# Patient Record
Sex: Male | Born: 1995 | Race: White | Hispanic: No | Marital: Single | Smoking: Never smoker
Health system: Southern US, Community
[De-identification: ages and names within clinical notes are randomized; demographics above are authoritative.]

## PROBLEM LIST (undated history)

## (undated) DIAGNOSIS — R011 Cardiac murmur, unspecified: Secondary | ICD-10-CM

## (undated) DIAGNOSIS — I38 Endocarditis, valve unspecified: Secondary | ICD-10-CM

## (undated) DIAGNOSIS — I050 Rheumatic mitral stenosis: Secondary | ICD-10-CM

## (undated) DIAGNOSIS — I350 Nonrheumatic aortic (valve) stenosis: Secondary | ICD-10-CM

## (undated) HISTORY — PX: HX HEART CATHETERIZATION: SHX148

## (undated) HISTORY — PX: HX HEART VALVE SURGERY: SHX40

## (undated) HISTORY — PX: AORTIC VALVE REPLACEMENT: SHX41

## (undated) HISTORY — PX: EXPLORATION POST OPERATIVE OPEN HEART: SHX5061

---
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
## 2015-02-07 ENCOUNTER — Encounter (HOSPITAL_COMMUNITY): Payer: Self-pay

## 2015-02-07 ENCOUNTER — Emergency Department (HOSPITAL_COMMUNITY): Payer: Worker's Compensation | Admitting: Anesthesiology

## 2015-02-07 ENCOUNTER — Inpatient Hospital Stay (HOSPITAL_COMMUNITY)
Admission: EM | Admit: 2015-02-07 | Discharge: 2015-02-12 | DRG: 956 | Disposition: A | Discharge status: Home Health | Payer: Worker's Compensation | Attending: General Surgery | Admitting: General Surgery

## 2015-02-07 ENCOUNTER — Inpatient Hospital Stay (HOSPITAL_COMMUNITY): Payer: Worker's Compensation

## 2015-02-07 ENCOUNTER — Encounter (HOSPITAL_COMMUNITY): Admission: EM | Disposition: A | Discharge status: Home Health | Payer: Self-pay | Source: Home / Self Care

## 2015-02-07 ENCOUNTER — Emergency Department (HOSPITAL_COMMUNITY): Payer: Worker's Compensation

## 2015-02-07 DIAGNOSIS — D62 Acute posthemorrhagic anemia: Secondary | ICD-10-CM | POA: Diagnosis not present

## 2015-02-07 DIAGNOSIS — S02609A Fracture of mandible, unspecified, initial encounter for closed fracture: Secondary | ICD-10-CM

## 2015-02-07 DIAGNOSIS — S62109A Fracture of unspecified carpal bone, unspecified wrist, initial encounter for closed fracture: Secondary | ICD-10-CM

## 2015-02-07 DIAGNOSIS — S62102A Fracture of unspecified carpal bone, left wrist, initial encounter for closed fracture: Secondary | ICD-10-CM

## 2015-02-07 DIAGNOSIS — S0181XA Laceration without foreign body of other part of head, initial encounter: Secondary | ICD-10-CM | POA: Diagnosis present

## 2015-02-07 DIAGNOSIS — S0261XA Fracture of condylar process of mandible, initial encounter for closed fracture: Secondary | ICD-10-CM | POA: Diagnosis present

## 2015-02-07 DIAGNOSIS — S025XXA Fracture of tooth (traumatic), initial encounter for closed fracture: Secondary | ICD-10-CM | POA: Diagnosis present

## 2015-02-07 DIAGNOSIS — S82002A Unspecified fracture of left patella, initial encounter for closed fracture: Secondary | ICD-10-CM | POA: Diagnosis present

## 2015-02-07 DIAGNOSIS — S7292XA Unspecified fracture of left femur, initial encounter for closed fracture: Secondary | ICD-10-CM

## 2015-02-07 DIAGNOSIS — Z952 Presence of prosthetic heart valve: Secondary | ICD-10-CM | POA: Diagnosis not present

## 2015-02-07 DIAGNOSIS — S3282XA Multiple fractures of pelvis without disruption of pelvic ring, initial encounter for closed fracture: Secondary | ICD-10-CM | POA: Diagnosis present

## 2015-02-07 DIAGNOSIS — W132XXA Fall from, out of or through roof, initial encounter: Secondary | ICD-10-CM | POA: Diagnosis present

## 2015-02-07 DIAGNOSIS — S62309A Unspecified fracture of unspecified metacarpal bone, initial encounter for closed fracture: Secondary | ICD-10-CM | POA: Diagnosis present

## 2015-02-07 DIAGNOSIS — T1490XA Injury, unspecified, initial encounter: Secondary | ICD-10-CM

## 2015-02-07 DIAGNOSIS — S52122A Displaced fracture of head of left radius, initial encounter for closed fracture: Secondary | ICD-10-CM | POA: Diagnosis present

## 2015-02-07 DIAGNOSIS — IMO0002 Reserved for concepts with insufficient information to code with codable children: Secondary | ICD-10-CM

## 2015-02-07 DIAGNOSIS — W19XXXA Unspecified fall, initial encounter: Secondary | ICD-10-CM | POA: Diagnosis present

## 2015-02-07 DIAGNOSIS — S060XAA Concussion with loss of consciousness status unknown, initial encounter: Secondary | ICD-10-CM | POA: Diagnosis present

## 2015-02-07 DIAGNOSIS — T1490 Injury, unspecified: Secondary | ICD-10-CM | POA: Diagnosis present

## 2015-02-07 DIAGNOSIS — S7292XB Unspecified fracture of left femur, initial encounter for open fracture type I or II: Secondary | ICD-10-CM | POA: Diagnosis present

## 2015-02-07 DIAGNOSIS — S52502A Unspecified fracture of the lower end of left radius, initial encounter for closed fracture: Secondary | ICD-10-CM | POA: Diagnosis present

## 2015-02-07 DIAGNOSIS — S060X9A Concussion with loss of consciousness of unspecified duration, initial encounter: Secondary | ICD-10-CM | POA: Diagnosis present

## 2015-02-07 DIAGNOSIS — S02600B Fracture of unspecified part of body of mandible, initial encounter for open fracture: Secondary | ICD-10-CM

## 2015-02-07 DIAGNOSIS — S329XXA Fracture of unspecified parts of lumbosacral spine and pelvis, initial encounter for closed fracture: Secondary | ICD-10-CM

## 2015-02-07 HISTORY — PX: CLOSED REDUCTION MANDIBLE WITH MANDIBULOMA: SHX5313

## 2015-02-07 HISTORY — PX: CLOSED REDUCTION RADIAL SHAFT: SHX5008

## 2015-02-07 HISTORY — DX: Nonrheumatic aortic (valve) stenosis: I35.0

## 2015-02-07 HISTORY — PX: ORIF RADIAL FRACTURE: SHX5113

## 2015-02-07 HISTORY — DX: Rheumatic mitral stenosis: I05.0

## 2015-02-07 HISTORY — PX: ORIF FEMUR FRACTURE: SHX2119

## 2015-02-07 HISTORY — PX: FACIAL LACERATION REPAIR: SHX6589

## 2015-02-07 HISTORY — PX: OPEN REDUCTION INTERNAL FIXATION (ORIF) DISTAL RADIAL FRACTURE: SHX5989

## 2015-02-07 HISTORY — PX: CLOSED REDUCTION MANDIBLE: SHX5307

## 2015-02-07 LAB — I-STAT CHEM 8, ED
BUN: 12 mg/dL (ref 6–23)
CREATININE: 1 mg/dL (ref 0.50–1.35)
Calcium, Ion: 1.19 mmol/L (ref 1.12–1.23)
Chloride: 105 mmol/L (ref 96–112)
GLUCOSE: 135 mg/dL — AB (ref 70–99)
HEMATOCRIT: 52 % (ref 39.0–52.0)
Hemoglobin: 17.7 g/dL — ABNORMAL HIGH (ref 13.0–17.0)
Potassium: 2.9 mmol/L — ABNORMAL LOW (ref 3.5–5.1)
Sodium: 142 mmol/L (ref 135–145)
TCO2: 18 mmol/L (ref 0–100)

## 2015-02-07 LAB — COMPREHENSIVE METABOLIC PANEL
ALT: 45 U/L (ref 0–53)
AST: 63 U/L — ABNORMAL HIGH (ref 0–37)
Albumin: 4.9 g/dL (ref 3.5–5.2)
Alkaline Phosphatase: 90 U/L (ref 39–117)
Anion gap: 13 (ref 5–15)
BUN: 11 mg/dL (ref 6–23)
CO2: 21 mmol/L (ref 19–32)
CREATININE: 1.11 mg/dL (ref 0.50–1.35)
Calcium: 10.1 mg/dL (ref 8.4–10.5)
Chloride: 105 mmol/L (ref 96–112)
GFR calc Af Amer: >90 mL/min (ref >90)
Glucose, Bld: 124 mg/dL — ABNORMAL HIGH (ref 70–99)
POTASSIUM: 3.1 mmol/L — AB (ref 3.5–5.1)
Sodium: 139 mmol/L (ref 135–145)
Total Bilirubin: 0.7 mg/dL (ref 0.3–1.2)
Total Protein: 7.9 g/dL (ref 6.0–8.3)

## 2015-02-07 LAB — CBC
HCT: 47.4 % (ref 39.0–52.0)
HEMOGLOBIN: 16.5 g/dL (ref 13.0–17.0)
MCH: 30.4 pg (ref 26.0–34.0)
MCHC: 34.8 g/dL (ref 30.0–36.0)
MCV: 87.3 fL (ref 78.0–100.0)
Platelets: 328 10*3/uL (ref 150–400)
RBC: 5.43 MIL/uL (ref 4.22–5.81)
RDW: 13 % (ref 11.5–15.5)
WBC: 20.9 10*3/uL — ABNORMAL HIGH (ref 4.0–10.5)

## 2015-02-07 LAB — ETHANOL

## 2015-02-07 LAB — TYPE AND SCREEN
ABO/RH(D): O POS
ANTIBODY SCREEN: NEGATIVE

## 2015-02-07 LAB — PROTIME-INR
INR: 1.16 (ref 0.00–1.49)
Prothrombin Time: 14.9 seconds (ref 11.6–15.2)

## 2015-02-07 LAB — I-STAT CG4 LACTIC ACID, ED: Lactic Acid, Venous: 3.53 mmol/L (ref 0.5–2.0)

## 2015-02-07 SURGERY — OPEN REDUCTION INTERNAL FIXATION (ORIF) DISTAL RADIUS FRACTURE
Anesthesia: General | Site: Face | Laterality: Left

## 2015-02-07 MED ORDER — PROPOFOL 10 MG/ML IV BOLUS
INTRAVENOUS | Status: AC
  Filled 2015-02-07: qty 20

## 2015-02-07 MED ORDER — CEFAZOLIN SODIUM-DEXTROSE 2-3 GM-% IV SOLR
INTRAVENOUS | Status: AC
  Filled 2015-02-07: qty 50

## 2015-02-07 MED ORDER — BUPIVACAINE-EPINEPHRINE (PF) 0.5% -1:200000 IJ SOLN
INTRAMUSCULAR | Status: AC
  Filled 2015-02-07: qty 30

## 2015-02-07 MED ORDER — GLYCOPYRROLATE 0.2 MG/ML IJ SOLN
INTRAMUSCULAR | Status: DC | PRN
  Administered 2015-02-07: 0.4 mg via INTRAVENOUS

## 2015-02-07 MED ORDER — CEFAZOLIN SODIUM-DEXTROSE 2-3 GM-% IV SOLR
2.0000 g | Freq: Once | INTRAVENOUS | Status: AC
  Administered 2015-02-07: 2 g via INTRAVENOUS
  Filled 2015-02-07: qty 50

## 2015-02-07 MED ORDER — ONDANSETRON HCL 4 MG/2ML IJ SOLN
INTRAMUSCULAR | Status: AC
  Filled 2015-02-07: qty 2

## 2015-02-07 MED ORDER — HYDROMORPHONE HCL 1 MG/ML IJ SOLN: 0.2500 mg | INTRAMUSCULAR | Status: DC | PRN

## 2015-02-07 MED ORDER — LIDOCAINE HCL (CARDIAC) 20 MG/ML IV SOLN
INTRAVENOUS | Status: DC | PRN
  Administered 2015-02-07: 80 mg via INTRAVENOUS

## 2015-02-07 MED ORDER — DIPHENHYDRAMINE HCL 50 MG/ML IJ SOLN
INTRAMUSCULAR | Status: AC
  Filled 2015-02-07: qty 2

## 2015-02-07 MED ORDER — PROMETHAZINE HCL 6.25 MG/5ML PO SYRP
25.0000 mg | ORAL_SOLUTION | Freq: Four times a day (QID) | ORAL | Status: DC | PRN
  Administered 2015-02-11: 25 mg via ORAL
  Filled 2015-02-07 (×3): qty 20

## 2015-02-07 MED ORDER — ROCURONIUM BROMIDE 100 MG/10ML IV SOLN
INTRAVENOUS | Status: DC | PRN
  Administered 2015-02-07: 20 mg via INTRAVENOUS
  Administered 2015-02-07: 30 mg via INTRAVENOUS

## 2015-02-07 MED ORDER — SUCCINYLCHOLINE CHLORIDE 20 MG/ML IJ SOLN
INTRAMUSCULAR | Status: AC
  Filled 2015-02-07: qty 2

## 2015-02-07 MED ORDER — FENTANYL CITRATE 0.05 MG/ML IJ SOLN
INTRAMUSCULAR | Status: AC
  Filled 2015-02-07: qty 5

## 2015-02-07 MED ORDER — LACTATED RINGERS IV SOLN
INTRAVENOUS | Status: DC | PRN
  Administered 2015-02-07 (×3): via INTRAVENOUS

## 2015-02-07 MED ORDER — FENTANYL CITRATE 0.05 MG/ML IJ SOLN
INTRAMUSCULAR | Status: AC
  Filled 2015-02-07: qty 2

## 2015-02-07 MED ORDER — PROPOFOL 10 MG/ML IV BOLUS
INTRAVENOUS | Status: DC | PRN
  Administered 2015-02-07: 200 mg via INTRAVENOUS
  Administered 2015-02-07: 100 mg via INTRAVENOUS

## 2015-02-07 MED ORDER — LIDOCAINE HCL (CARDIAC) 20 MG/ML IV SOLN
INTRAVENOUS | Status: AC
  Filled 2015-02-07: qty 5

## 2015-02-07 MED ORDER — ALBUMIN HUMAN 5 % IV SOLN
INTRAVENOUS | Status: DC | PRN
  Administered 2015-02-07 (×2): via INTRAVENOUS

## 2015-02-07 MED ORDER — NEOSTIGMINE METHYLSULFATE 10 MG/10ML IV SOLN
INTRAVENOUS | Status: DC | PRN
  Administered 2015-02-07: 3 mg via INTRAVENOUS

## 2015-02-07 MED ORDER — ARTIFICIAL TEARS OP OINT
TOPICAL_OINTMENT | OPHTHALMIC | Status: AC
  Filled 2015-02-07: qty 3.5

## 2015-02-07 MED ORDER — IOHEXOL 300 MG/ML  SOLN: 100.0000 mL | Freq: Once | INTRAMUSCULAR | Status: AC | PRN

## 2015-02-07 MED ORDER — LIDOCAINE-EPINEPHRINE 1 %-1:100000 IJ SOLN
INTRAMUSCULAR | Status: AC
  Filled 2015-02-07: qty 1

## 2015-02-07 MED ORDER — ONDANSETRON HCL 4 MG/2ML IJ SOLN: 4.0000 mg | Freq: Four times a day (QID) | INTRAMUSCULAR | Status: DC | PRN

## 2015-02-07 MED ORDER — DIPHENHYDRAMINE HCL 50 MG/ML IJ SOLN
INTRAMUSCULAR | Status: DC | PRN
  Administered 2015-02-07: 25 mg via INTRAVENOUS

## 2015-02-07 MED ORDER — MIDAZOLAM HCL 2 MG/2ML IJ SOLN
INTRAMUSCULAR | Status: AC
  Filled 2015-02-07: qty 2

## 2015-02-07 MED ORDER — SODIUM CHLORIDE 0.9 % IJ SOLN
INTRAMUSCULAR | Status: AC
  Filled 2015-02-07: qty 10

## 2015-02-07 MED ORDER — FENTANYL CITRATE 0.05 MG/ML IJ SOLN
INTRAMUSCULAR | Status: DC | PRN
  Administered 2015-02-07: 100 ug via INTRAVENOUS
  Administered 2015-02-07: 50 ug via INTRAVENOUS
  Administered 2015-02-07: 100 ug via INTRAVENOUS
  Administered 2015-02-07 (×5): 50 ug via INTRAVENOUS

## 2015-02-07 MED ORDER — MIDAZOLAM HCL 5 MG/5ML IJ SOLN
INTRAMUSCULAR | Status: DC | PRN
  Administered 2015-02-07 (×2): 1 mg via INTRAVENOUS

## 2015-02-07 MED ORDER — 0.9 % SODIUM CHLORIDE (POUR BTL) OPTIME
TOPICAL | Status: DC | PRN
  Administered 2015-02-07: 1500 mL

## 2015-02-07 MED ORDER — ROCURONIUM BROMIDE 50 MG/5ML IV SOLN
INTRAVENOUS | Status: AC
  Filled 2015-02-07: qty 1

## 2015-02-07 MED ORDER — LIDOCAINE-EPINEPHRINE 1 %-1:100000 IJ SOLN
INTRAMUSCULAR | Status: DC | PRN
  Administered 2015-02-07: 8 mL

## 2015-02-07 MED ORDER — EPHEDRINE SULFATE 50 MG/ML IJ SOLN
INTRAMUSCULAR | Status: AC
  Filled 2015-02-07: qty 1

## 2015-02-07 MED ORDER — GLYCOPYRROLATE 0.2 MG/ML IJ SOLN
INTRAMUSCULAR | Status: AC
  Filled 2015-02-07: qty 3

## 2015-02-07 MED ORDER — OXYCODONE HCL 5 MG PO TABS: 5.0000 mg | ORAL_TABLET | Freq: Once | ORAL | Status: AC | PRN

## 2015-02-07 MED ORDER — DEXAMETHASONE SODIUM PHOSPHATE 10 MG/ML IJ SOLN
INTRAMUSCULAR | Status: DC | PRN
  Administered 2015-02-07: 10 mg via INTRAVENOUS

## 2015-02-07 MED ORDER — CEFAZOLIN SODIUM-DEXTROSE 2-3 GM-% IV SOLR
2.0000 g | INTRAVENOUS | Status: AC
  Administered 2015-02-07: 2 g via INTRAVENOUS

## 2015-02-07 MED ORDER — NEOSTIGMINE METHYLSULFATE 10 MG/10ML IV SOLN
INTRAVENOUS | Status: AC
  Filled 2015-02-07: qty 1

## 2015-02-07 MED ORDER — FENTANYL CITRATE 0.05 MG/ML IJ SOLN
100.0000 ug | Freq: Once | INTRAMUSCULAR | Status: AC
  Administered 2015-02-07: 100 ug via INTRAVENOUS

## 2015-02-07 MED ORDER — OXYMETAZOLINE HCL 0.05 % NA SOLN
NASAL | Status: DC | PRN
  Administered 2015-02-07: 2 via NASAL

## 2015-02-07 MED ORDER — PROMETHAZINE HCL 25 MG RE SUPP: 25.0000 mg | Freq: Four times a day (QID) | RECTAL | Status: DC | PRN

## 2015-02-07 MED ORDER — ONDANSETRON HCL 4 MG/2ML IJ SOLN
INTRAMUSCULAR | Status: DC | PRN
  Administered 2015-02-07: 4 mg via INTRAVENOUS

## 2015-02-07 MED ORDER — SUCCINYLCHOLINE CHLORIDE 20 MG/ML IJ SOLN
INTRAMUSCULAR | Status: DC | PRN
  Administered 2015-02-07: 140 mg via INTRAVENOUS

## 2015-02-07 MED ORDER — FENTANYL CITRATE 0.05 MG/ML IJ SOLN
INTRAMUSCULAR | Status: AC
  Administered 2015-02-07: 100 ug
  Filled 2015-02-07: qty 2

## 2015-02-07 MED ORDER — ONDANSETRON HCL 4 MG/2ML IJ SOLN
INTRAMUSCULAR | Status: AC
  Administered 2015-02-07: 4 mg
  Filled 2015-02-07: qty 2

## 2015-02-07 MED ORDER — CEFAZOLIN SODIUM-DEXTROSE 2-3 GM-% IV SOLR: 2.0000 g | INTRAVENOUS | Status: DC

## 2015-02-07 MED ORDER — PHENYLEPHRINE HCL 10 MG/ML IJ SOLN
INTRAMUSCULAR | Status: AC
  Filled 2015-02-07: qty 1

## 2015-02-07 MED ORDER — ONDANSETRON HCL 4 MG/2ML IJ SOLN
INTRAMUSCULAR | Status: AC
  Filled 2015-02-07: qty 6

## 2015-02-07 MED ORDER — CHLORHEXIDINE GLUCONATE 0.12 % MT SOLN
5.0000 mL | Freq: Four times a day (QID) | OROMUCOSAL | Status: DC
  Administered 2015-02-08 – 2015-02-12 (×13): 5 mL via OROMUCOSAL
  Filled 2015-02-07 (×20): qty 15

## 2015-02-07 MED ORDER — OXYCODONE HCL 5 MG/5ML PO SOLN: 5.0000 mg | Freq: Once | ORAL | Status: AC | PRN

## 2015-02-07 MED ORDER — OXYMETAZOLINE HCL 0.05 % NA SOLN
NASAL | Status: AC
  Filled 2015-02-07: qty 15

## 2015-02-07 SURGICAL SUPPLY — 100 items
3.5/2.7MM PLATE REDUCTION TOOL ×5 IMPLANT
BANDAGE ELASTIC 4 VELCRO ST LF (GAUZE/BANDAGES/DRESSINGS) ×20 IMPLANT
BANDAGE ELASTIC 6 VELCRO ST LF (GAUZE/BANDAGES/DRESSINGS) ×5 IMPLANT
BANDAGE ESMARK 6X9 LF (GAUZE/BANDAGES/DRESSINGS) ×4 IMPLANT
BIT DRILL 2 FAST STEP (BIT) ×5 IMPLANT
BIT DRILL 4.3 (BIT) ×5
BIT DRILL 4.3X300MM (BIT) ×4 IMPLANT
BIT DRILL CANN 1.6 BIODRIVE (BIT) ×5 IMPLANT
BIT DRILL QC 3.3X195 (BIT) ×5 IMPLANT
BLADE SURG 15 STRL LF DISP TIS (BLADE) ×4 IMPLANT
BLADE SURG 15 STRL SS (BLADE) ×1
BLADE SURG ROTATE 9660 (MISCELLANEOUS) IMPLANT
BNDG COHESIVE 6X5 TAN STRL LF (GAUZE/BANDAGES/DRESSINGS) ×5 IMPLANT
BNDG ESMARK 6X9 LF (GAUZE/BANDAGES/DRESSINGS) ×5
BNDG GAUZE ELAST 4 BULKY (GAUZE/BANDAGES/DRESSINGS) ×10 IMPLANT
CAP LOCK NCB (Cap) ×25 IMPLANT
COVER SURGICAL LIGHT HANDLE (MISCELLANEOUS) ×10 IMPLANT
CUFF TOURNIQUET SINGLE 34IN LL (TOURNIQUET CUFF) IMPLANT
DERMABOND ADVANCED (GAUZE/BANDAGES/DRESSINGS) ×1
DERMABOND ADVANCED .7 DNX12 (GAUZE/BANDAGES/DRESSINGS) ×4 IMPLANT
DRAPE C-ARM 42X72 X-RAY (DRAPES) ×5 IMPLANT
DRAPE IMP U-DRAPE 54X76 (DRAPES) ×5 IMPLANT
DRAPE INCISE IOBAN 66X45 STRL (DRAPES) ×5 IMPLANT
DRAPE OEC MINIVIEW 54X84 (DRAPES) ×5 IMPLANT
DRAPE ORTHO SPLIT 77X108 STRL (DRAPES) ×2
DRAPE PROXIMA HALF (DRAPES) ×10 IMPLANT
DRAPE SURG ORHT 6 SPLT 77X108 (DRAPES) ×8 IMPLANT
DRAPE U-SHAPE 47X51 STRL (DRAPES) ×5 IMPLANT
DRILL BIT NCB 2.5 (BIT) ×5 IMPLANT
DRSG ADAPTIC 3X8 NADH LF (GAUZE/BANDAGES/DRESSINGS) ×5 IMPLANT
DRSG EMULSION OIL 3X3 NADH (GAUZE/BANDAGES/DRESSINGS) ×5 IMPLANT
DRSG MEPILEX BORDER 4X4 (GAUZE/BANDAGES/DRESSINGS) ×5 IMPLANT
DRSG MEPILEX BORDER 4X8 (GAUZE/BANDAGES/DRESSINGS) ×5 IMPLANT
DRSG PAD ABDOMINAL 8X10 ST (GAUZE/BANDAGES/DRESSINGS) ×5 IMPLANT
ELECT REM PT RETURN 9FT ADLT (ELECTROSURGICAL) ×5
ELECTRODE REM PT RTRN 9FT ADLT (ELECTROSURGICAL) ×4 IMPLANT
GLOVE BIO SURGEON STRL SZ7 (GLOVE) ×5 IMPLANT
GLOVE BIO SURGEON STRL SZ7.5 (GLOVE) ×15 IMPLANT
GLOVE BIOGEL PI IND STRL 7.0 (GLOVE) ×8 IMPLANT
GLOVE BIOGEL PI IND STRL 8 (GLOVE) ×4 IMPLANT
GLOVE BIOGEL PI INDICATOR 7.0 (GLOVE) ×2
GLOVE BIOGEL PI INDICATOR 8 (GLOVE) ×1
GLOVE SURG ORTHO 7.0 STRL STRW (GLOVE) ×5 IMPLANT
GLOVE SURG SS PI 7.0 STRL IVOR (GLOVE) IMPLANT
GOWN STRL REUS W/ TWL LRG LVL3 (GOWN DISPOSABLE) ×8 IMPLANT
GOWN STRL REUS W/ TWL XL LVL3 (GOWN DISPOSABLE) ×4 IMPLANT
GOWN STRL REUS W/TWL LRG LVL3 (GOWN DISPOSABLE) ×2
GOWN STRL REUS W/TWL XL LVL3 (GOWN DISPOSABLE) ×1
K-WIRE 2.0 (WIRE) ×5
K-WIRE FXSTD 280X2XNS SS (WIRE) ×20
KIT BASIN OR (CUSTOM PROCEDURE TRAY) ×5 IMPLANT
KIT ROOM TURNOVER OR (KITS) ×5 IMPLANT
KWIRE FXSTD 280X2XNS SS (WIRE) ×20 IMPLANT
MANIFOLD NEPTUNE II (INSTRUMENTS) ×5 IMPLANT
NCB Locking Cap ×25 IMPLANT
NEEDLE 22X1 1/2 (OR ONLY) (NEEDLE) ×5 IMPLANT
NS IRRIG 1000ML POUR BTL (IV SOLUTION) ×5 IMPLANT
PACK GENERAL/GYN (CUSTOM PROCEDURE TRAY) ×5 IMPLANT
PACK UNIVERSAL I (CUSTOM PROCEDURE TRAY) ×5 IMPLANT
PAD ARMBOARD 7.5X6 YLW CONV (MISCELLANEOUS) ×10 IMPLANT
PAD CAST 4YDX4 CTTN HI CHSV (CAST SUPPLIES) ×12 IMPLANT
PADDING CAST ABS 4INX4YD NS (CAST SUPPLIES) ×5
PADDING CAST ABS COTTON 4X4 ST (CAST SUPPLIES) ×20 IMPLANT
PADDING CAST COTTON 4X4 STRL (CAST SUPPLIES) ×3
PLATE DIST FEMUR NCB 9 HOLE (Plate) ×5 IMPLANT
PLATE LOCK 2.5MM Y-SHAPE (Plate) ×5 IMPLANT
PLATE REDUCTION TOOL 3.5/2.7MM (Plate) ×5 IMPLANT
SCREW 5.0 80MM (Screw) ×15 IMPLANT
SCREW BIODRIVE MICRO 2.3X20 (Screw) ×5 IMPLANT
SCREW BIODRIVE MICRO 2.3X24 (Screw) ×5 IMPLANT
SCREW CANCELLOUS 5.0X80 (Screw) ×5 IMPLANT
SCREW CANCELLOUS 5.0X85 (Screw) ×5 IMPLANT
SCREW MULTI DIRECT 14MM (Screw) ×5 IMPLANT
SCREW MULTI DIRECT 16MM (Screw) ×5 IMPLANT
SCREW NCB 4.0MX44M (Screw) ×5 IMPLANT
SCREW NCB 4.0MX50M (Screw) ×5 IMPLANT
SCREW NCB 5.0X34MM (Screw) ×10 IMPLANT
SCREW NCB 5.0X36MM (Screw) ×10 IMPLANT
SCREW PEG 2.5X16 NONLOCK (Screw) ×10 IMPLANT
SCREW UPPER FACE 2.0X12MM (Screw) ×40 IMPLANT
SLEEVE SURGEON STRL (DRAPES) ×5 IMPLANT
SPLINT PLASTER CAST XFAST 4X15 (CAST SUPPLIES) ×20 IMPLANT
SPLINT PLASTER XTRA FAST SET 4 (CAST SUPPLIES) ×5
SPONGE GAUZE 4X4 12PLY STER LF (GAUZE/BANDAGES/DRESSINGS) ×10 IMPLANT
SPONGE LAP 18X18 X RAY DECT (DISPOSABLE) ×5 IMPLANT
STOCKINETTE IMPERVIOUS LG (DRAPES) ×5 IMPLANT
SUCTION FRAZIER TIP 10 FR DISP (SUCTIONS) ×5 IMPLANT
SUT ETHILON 3 0 PS 1 (SUTURE) ×25 IMPLANT
SUT MNCRL AB 4-0 PS2 18 (SUTURE) ×5 IMPLANT
SUT MON AB 2-0 CT1 27 (SUTURE) ×5 IMPLANT
SUT VIC AB 0 CT1 27 (SUTURE) ×2
SUT VIC AB 0 CT1 27XBRD ANBCTR (SUTURE) ×8 IMPLANT
SYR BULB 3OZ (MISCELLANEOUS) ×5 IMPLANT
SYR CONTROL 10ML LL (SYRINGE) ×5 IMPLANT
TOWEL OR 17X24 6PK STRL BLUE (TOWEL DISPOSABLE) ×5 IMPLANT
TOWEL OR 17X26 10 PK STRL BLUE (TOWEL DISPOSABLE) ×5 IMPLANT
TOWEL OR NON WOVEN STRL DISP B (DISPOSABLE) ×5 IMPLANT
TRAY FOLEY CATH 16FRSI W/METER (SET/KITS/TRAYS/PACK) ×5 IMPLANT
WATER STERILE IRR 1000ML POUR (IV SOLUTION) ×5 IMPLANT
WIRE K 1.6MM 144256 (MISCELLANEOUS) ×15 IMPLANT

## 2015-02-07 NOTE — Op Note (Signed)
02/07/2015  8:18 PM    Granade, Homero FellersFrank  [id number]   Pre-Op Dx:  RIGHT condylar head, LEFT comminuted body  Mandible fracture(s), LEFT forehead laceration, LEFT chin laceration  Post-op Dx: same  Proc: Mandibulo-maxillary fixation , closure lacerations  Surg:  Flo ShanksWOLICKI, Maryfer Tauzin T MD  Anes:  GNT  EBL:  min  Comp:  none  Findings:   4 cm LEFT forehead lac through the frontalis muscle but with no exposed bone.  3 cm LEFT chin laceration with no exposed mandible.  Multiple fractures teeth.  Mobile tooth #21.  Good occlusion established.  Procedure: With the patient in a comfortable supine position, general nasotracheal anesthesia was induced without difficulty.  He had received preoperative Afrin spray for nasal airway decongestion and to prevent epistaxis with intubation.  The case was done contemporaneously with Dr. Renaye Rakersim Murphy.  At an appropriate level, the patient was in a supine position.    The pharynx was suctioned clear.  A throat pack composed of 2 x 2's sutured with a 2-0 silk was moistened and then placed. Betadine solution tooth brushing was performed by way of preparation.  This was suctioned clear.  1% Xylocaine with 1:100,000 epinephrine,  8 cc's total, was infiltrated to each side of the pyriform aperture, and on the anterior inferior mandible on each side in anticipation of bicortical screw placement.  Several minutes were allowed for this to take effect.  The jaws were manipulated to reestablish occlusion.   X-rays were reviewed.  The LEFT chin and LEFT forehead laceration had a standard sterile preparation and draping.  They were closed with two layers of 4-0 Vicryl, then Dermabond on the skin.    A small stab incision was made at each screw site  down to the bone.  12 mm bicortical screws were placed superomedial to the canine roots maxillary, and inferomedial to the canine roots mandibular. The LEFT mandibular screw had to be placed slightly more medially to avoid the  fracture line.   Hemostasis was observed.  24-gauge stainless steel wire loops were prepared.  The pharynx was suctioned free and the throat pack was removed.  The jaws were manipulated back into occlusion.  Two vertical and two crisscross wire loops were applied and gently tightened down.  Good occlusion was noted.  Tightening was completed.  The wires were cut and then twisted with the ends buried to avoid trauma to the oral vestibule.  Hemostasis was observed.  At this point, Dr. [name] was continuing with his repairs.  The Otolaryngology portion of the procedure was completed.   Dispo:   PACU to 23 hour observation.  Plan: Ice, elevation,  Narcotic analgesia, antiemetics.  We will send him home with wire cutters.  We will check a Panorex prior to discharge.  He will receive a dietary consult regarding liquid diet.  Recheck my office one week.   Cephus RicherWOLICKI,  Armando Bukhari T MD

## 2015-02-07 NOTE — ED Notes (Signed)
Pt in radiology with Lawson FiscalLori, RN

## 2015-02-07 NOTE — ED Notes (Signed)
Per [facility], pt working on roof and fell 30" through industrial roof, has left open femur fx, left forearm deformity. 3 inch to left forehead. Pt was confused with EMS, A and oriented on arrival to ED. Good pulses to all extremities. 1 inch to left chin. Found lying 5 ft from a shelf. Unsure if he fell straight down.

## 2015-02-07 NOTE — Progress Notes (Signed)
Chaplain responded to level two trauma, fall from roof. Chaplain present at pt arrival. Called pt father, Mills KollerRichard Bas [phone], upon pt request. Pt coworkers at hospital. Lunette Standshaplain offered them hospitality and escorted them to second floor [facility] waiting area. Pt [place], will need to be updated upon arrival. After CT chaplain briefly spoke with pt. Pt reported being scared and chaplain affirmed these feelings. Pt worried about his teeth during surgery, MD reassured pt at bedside. Chaplain will continue to follow. Page as needed [phone]. StameyMayer Masker, Glenyce Randle F, Chaplain 02/07/2015  5:49 PM

## 2015-02-07 NOTE — H&P (Signed)
History   [name] [name] is an 19 y.o. male.   Chief Complaint:  Chief Complaint  Patient presents with  . Trauma   Was at work working on roof when he took a wrong step and fell thru ceiling/insulation about 73ft; amnesic to event. C/o of jaw pain, loose teeth, L wrist/leg pain.   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 Takes 325 ASA a day.  Trauma Mechanism of injury: fall Injury location: face, mouth, hand, leg and pelvis Injury location detail: lower teeth, forehead, L wrist, pelvis and L knee and L upper leg Incident location: at work Arrived directly from scene: yes   Fall:      Fall occurred: from a roof      Height of fall: 9ft      Impact surface: unknown      Point of impact: unknown      Entrapped after fall: no      Suspicion of alcohol use: no      Suspicion of drug use: no  EMS/PTA data:      Ambulatory at scene: no      Blood loss: minimal      Responsiveness: alert      Oriented to: person, situation and place      Loss of consciousness: yes      Amnesic to event: yes      Airway interventions: suction      Breathing interventions: oxygen      IV access: established      Medications administered: fentanyl      Immobilization: C-collar, long board and LLE splint      Mental status condition since incident: improving  Current symptoms:      Pain scale: 8/10      Pain quality: sharp and pressure      Associated symptoms:            Reports back pain, loss of consciousness and nausea.            Denies abdominal pain, blindness, chest pain, difficulty breathing, headache, neck pain, seizures and vomiting.   Relevant PMH:      Tetanus status: UTD   Past Medical History  Diagnosis Date  . Aortic valve stenosis   . Mitral valve stenosis     Past Surgical History  Procedure Laterality Date  . Exploration post operative open heart      x 2  . 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 No  pertinent family history. Social History:  reports that he has never smoked. He uses smokeless tobacco. His alcohol and drug histories are not on file.  Allergies  No Known Allergies  Home Medications   (Not in a hospital admission)  Trauma Course   Results for orders placed or performed during the hospital encounter of 02/07/15 (from the past 48 hour(s))  Type and screen     Status: None   Collection Time: 02/07/15  3:39 PM  Result Value Ref Range   ABO/RH(D) O POS    Antibody Screen NEG    Sample Expiration 02/10/2015   I-Stat Chem 8, ED     Status: Abnormal   Collection Time: 02/07/15  3:42 PM  Result Value Ref Range   Sodium 142 135 - 145 mmol/L   Potassium 2.9 (L) 3.5 - 5.1 mmol/L   Chloride 105 96 - 112 mmol/L   BUN 12 6 - 23 mg/dL  Creatinine, Ser 1.00 0.50 - 1.35 mg/dL   Glucose, Bld 161 (H) 70 - 99 mg/dL   Calcium, Ion 0.96 0.45 - 1.23 mmol/L   TCO2 18 0 - 100 mmol/L   Hemoglobin 17.7 (H) 13.0 - 17.0 g/dL   HCT 40.9 81.1 - 91.4 %  I-Stat CG4 Lactic Acid, ED     Status: Abnormal   Collection Time: 02/07/15  3:42 PM  Result Value Ref Range   Lactic Acid, Venous 3.53 (HH) 0.5 - 2.0 mmol/L   Comment NOTIFIED PHYSICIAN   Ethanol     Status: None   Collection Time: 02/07/15  3:43 PM  Result Value Ref Range   Alcohol, Ethyl (B) <5 0 - 9 mg/dL    Comment:        LOWEST DETECTABLE LIMIT FOR SERUM ALCOHOL IS 11 mg/dL FOR MEDICAL PURPOSES ONLY   CBC     Status: Abnormal   Collection Time: 02/07/15  5:00 PM  Result Value Ref Range   WBC 20.9 (H) 4.0 - 10.5 K/uL   RBC 5.43 4.22 - 5.81 MIL/uL   Hemoglobin 16.5 13.0 - 17.0 g/dL   HCT 78.2 95.6 - 21.3 %   MCV 87.3 78.0 - 100.0 fL   MCH 30.4 26.0 - 34.0 pg   MCHC 34.8 30.0 - 36.0 g/dL   RDW 08.6 57.8 - 46.9 %   Platelets 328 150 - 400 K/uL  Protime-INR     Status: None   Collection Time: 02/07/15  5:00 PM  Result Value Ref Range   Prothrombin Time 14.9 11.6 - 15.2 seconds   INR 1.16 0.00 - 1.49   Dg Forearm  Left  02/07/2015   CLINICAL DATA:  Fall through roof from 30 foot height. Left forearm pain and physical deformity. Initial encounter.  EXAM: LEFT FOREARM - 2 VIEW  COMPARISON:  None.  FINDINGS: A comminuted fracture of the distal radius is seen, with involvement of the radiocarpal and distal radial ulnar joints. There is posterior displacement and angulation of the distal radial fracture fragments.  In addition, there is a comminuted fracture of the radial head. No ulnar fracture identified.  IMPRESSION: Comminuted distal radius fracture with dorsal displacement and angulation.  Comminuted fracture of the radial head. Dedicated elbow radiographs recommended for further evaluation.   Electronically Signed   By: Myles Rosenthal M.D.   On: 02/07/2015 17:35   Dg Wrist Complete Left  02/07/2015   CLINICAL DATA:  Larey Seat while working on roof.  EXAM: LEFT WRIST - COMPLETE 3+ VIEW  COMPARISON:  None.  FINDINGS: There is a comminuted intra-articular fracture of the distal radius with marked dorsal displacement, impaction and dorsal angulation. There also is a fracture across the midportion of the triquetrum, minimally displaced. There is a transverse nondisplaced fracture across the capitate. At the radial aspect of the wrist, the scaphoid, trapezoid and trapezium are not well seen on this study due to positioning limitations and rotation.  IMPRESSION: 1. Comminuted intra-articular fracture of the distal radius with marked impaction, displacement and angulation. 2. Transverse fractures across the triquetrum and capitate 3. Limited visibility of the bones at the radial aspect of the wrist.   Electronically Signed   By: Ellery Plunk M.D.   On: 02/07/2015 17:38   Ct Head Wo Contrast  02/07/2015   CLINICAL DATA:  Larey Seat from 30 feet. Landed face first. Jaw pain. Initial encounter.  EXAM: CT HEAD WITHOUT CONTRAST  CT MAXILLOFACIAL WITHOUT CONTRAST  CT CERVICAL SPINE WITHOUT CONTRAST  TECHNIQUE: Multidetector CT imaging of the  head, cervical spine, and maxillofacial structures were performed using the standard protocol without intravenous contrast. Multiplanar CT image reconstructions of the cervical spine and maxillofacial structures were also generated.  COMPARISON:  Chest and pelvic films of same date.  FINDINGS: CT HEAD FINDINGS  Sinuses/Soft tissues: Soft tissue swelling and probable laceration about the left frontal scalp. Clear paranasal sinuses and mastoid air cells. No skull fracture. Clear mastoid air cells.  Intracranial: No mass lesion, hemorrhage, hydrocephalus, acute infarct, intra-axial, or extra-axial fluid collection.  CT MAXILLOFACIAL FINDINGS  Soft tissues: Soft tissue swelling and gas about the left side of the mandible. Normal appearance of the orbits and globes, without retrobulbar hemorrhage.  Bones: Comminuted fracture of the body of the left side of the mandible. Minimally displaced. Adjacent tooth fractures.  Minimally displaced, minimally comminuted fracture of the right mandibular condyle. Example image 38 of series 3. The condyle may be minimally subluxed anteriorly.  Fractures of multiple right-sided mandibular teeth. Teeth 29 through 31. At least 1 right maxillary to fracture, including on image 32 of series 3. Tooth 5.  Zygomatic arches are intact. No fluid in the paranasal sinuses or mastoid air cells. Pterygoid plates intact. Mucosal thickening of the right maxillary sinus.  Coronal reformats demonstrate intact orbital floors.  CT CERVICAL SPINE FINDINGS  Spinal visualization through the bottom of C5 on the initial exam. From the mid C6 level inferiorly are not well evaluated secondary to motion. Prevertebral soft tissues are within normal limits. No apical pneumothorax.  Skull base intact. Maintenance of vertebral body height through the C5 level. Facets are well-aligned. Coronal reformats demonstrate a normal C1-C2 articulation.  Subsequent repeat imaging includes from C6 through the bottom of T1. No  prevertebral soft tissue swelling across these levels. No fracture or subluxation identified.  IMPRESSION: 1. Left frontal scalp soft tissue swelling, without acute intracranial abnormality. 2. Left mandibular body comminuted fracture. Right mandibular condyle fracture with suggestion of subluxation. Bilateral fractured teeth. 3. No acute findings within the cervical spine.   Electronically Signed   By: Jeronimo Greaves M.D.   On: 02/07/2015 17:07   Ct Chest W Contrast  02/07/2015   CLINICAL DATA:  Level 1 trauma, patient fell 30 feet through the roof of a warehouse and landed face first onto concrete. Facial pain, left wrist pain, left knee pain. History of aortic valve stenosis and mitral valve stenosis and prior cardiac surgery.  EXAM: CT CHEST, ABDOMEN AND PELVIS WITHOUT CONTRAST  TECHNIQUE: Multidetector CT imaging of the chest, abdomen and pelvis was performed following the standard protocol without IV contrast.  COMPARISON:  Radiographs from 02/07/2015  FINDINGS: The patient was scanned with his arms by his sides, introducing streak artifact which can reduce sensitivity and specificity in assessing the upper abdominal organs.  CT CHEST FINDINGS  Mediastinum/Nodes: Dilated ascending aorta at 4 cm. Postoperative findings along the aortic valve. Anterior mediastinal density is probably either postoperative or due to thymus. I am less suspicious of this being acute hemorrhage based on the morphology and appearance.  No pathologic thoracic adenopathy. I do not observe an aortic dissection  Lungs/Pleura: No pneumothorax or pneumomediastinum. Subsegmental atelectasis posteriorly in the left lower lobe.  Musculoskeletal: No thoracic fracture identified. Multilevel Schmorl's nodes without kyphosis.  CT ABDOMEN PELVIS FINDINGS  Hepatobiliary: Unremarkable  Pancreas: Unremarkable  Spleen: Unremarkable  Adrenals/Urinary Tract: Unremarkable  Stomach/Bowel: Unremarkable  Vascular/Lymphatic: No vascular dissection or active  bleeding is identified.  Reproductive: Tissue planes around  the left side of the prostate gland are somewhat obscured due to hematoma.  Other: No ascites identified.  Musculoskeletal: Acute fracture, left sacrum, primarily involving the anterior portion of the left sacral ala and extending into the anterior portion of the left sacroiliac joint with some mild widening of the anterior portion of the left SI joint. The adjacent hematoma in the left presacral space with stranding tracking around the superior gluteal artery but without direct extravasation observed, and with hematoma tracking primarily at along the left piriformis muscle but also mildly expanding the left obturator internus. There stranding around the left sciatic nerve as it exits through the sciatic foramen.  As shown on conventional radiography, there are also left pubic rami fractures. These consist of a medial superior ramus fracture extending into the pubic body and upper margin of the pubic symphysis, as well as a mildly comminuted inferior pubic ramus fracture in the midportion of the inferior ramus. Indistinctness of adjacent tissue planes along the medial margins of the left hip adductor musculature noted. Again, no active bleeding is identified. No extension into the left acetabulum is observed. There is a small amount of hemorrhage in the left anterior states of right CS. The regional hematoma does not appear to be causing a great deal of impingement at the operator foramen.  IMPRESSION: 1. Acute pelvic fractures include the anterior upper left sacrum (extending into the left SI joint) ; the midportion of the inferior pubic ramus ; and the medial portion of the superior pubic ramus extending into the left pubic body. There is adjacent hematoma tracking in the vicinity of these fractures and a small amount of hematoma in the left anterior space of right CS, but we do not demonstrate active extravasation of contrast to suggest active bleeding.  Although there is hematoma tracking around the left operator internus and left piriformis muscles, the degree of impingement at the operator foramen and sciatic notch appears currently minimal. 2. 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 I suspect this is a chronic finding in this patient. There is some adjacent anterior mediastinal density which is probably postoperative in considered less likely to be due to active mediastinal hematoma given the small and bandlike orientation. I do not observe a dissection.   Electronically Signed   By: Gaylyn Rong M.D.   On: 02/07/2015 16:59   Ct Cervical Spine Wo Contrast  02/07/2015   CLINICAL DATA:  Larey Seat from 30 feet. Landed face first. Jaw pain. Initial encounter.  EXAM: CT HEAD WITHOUT CONTRAST  CT MAXILLOFACIAL WITHOUT CONTRAST  CT CERVICAL SPINE WITHOUT CONTRAST  TECHNIQUE: Multidetector CT imaging of the head, cervical spine, and maxillofacial structures were performed using the standard protocol without intravenous contrast. Multiplanar CT image reconstructions of the cervical spine and maxillofacial structures were also generated.  COMPARISON:  Chest and pelvic films of same date.  FINDINGS: CT HEAD FINDINGS  Sinuses/Soft tissues: Soft tissue swelling and probable laceration about the left frontal scalp. Clear paranasal sinuses and mastoid air cells. No skull fracture. Clear mastoid air cells.  Intracranial: No mass lesion, hemorrhage, hydrocephalus, acute infarct, intra-axial, or extra-axial fluid collection.  CT MAXILLOFACIAL FINDINGS  Soft tissues: Soft tissue swelling and gas about the left side of the mandible. Normal appearance of the orbits and globes, without retrobulbar hemorrhage.  Bones: Comminuted fracture of the body of the left side of the mandible. Minimally displaced. Adjacent tooth fractures.  Minimally displaced, minimally comminuted fracture of the right mandibular condyle.  Example image 38 of series 3.  The condyle may be minimally subluxed anteriorly.  Fractures of multiple right-sided mandibular teeth. Teeth 29 through 31. At least 1 right maxillary to fracture, including on image 32 of series 3. Tooth 5.  Zygomatic arches are intact. No fluid in the paranasal sinuses or mastoid air cells. Pterygoid plates intact. Mucosal thickening of the right maxillary sinus.  Coronal reformats demonstrate intact orbital floors.  CT CERVICAL SPINE FINDINGS  Spinal visualization through the bottom of C5 on the initial exam. From the mid C6 level inferiorly are not well evaluated secondary to motion. Prevertebral soft tissues are within normal limits. No apical pneumothorax.  Skull base intact. Maintenance of vertebral body height through the C5 level. Facets are well-aligned. Coronal reformats demonstrate a normal C1-C2 articulation.  Subsequent repeat imaging includes from C6 through the bottom of T1. No prevertebral soft tissue swelling across these levels. No fracture or subluxation identified.  IMPRESSION: 1. Left frontal scalp soft tissue swelling, without acute intracranial abnormality. 2. Left mandibular body comminuted fracture. Right mandibular condyle fracture with suggestion of subluxation. Bilateral fractured teeth. 3. No acute findings within the cervical spine.   Electronically Signed   By: Jeronimo Greaves M.D.   On: 02/07/2015 17:07   Ct Knee Left Wo Contrast  02/07/2015   CLINICAL DATA:  Level 1 trauma. Pull-through where house roof. Evaluate distal femur fracture. Initial encounter.  EXAM: CT OF THE LEFT KNEE WITHOUT CONTRAST  TECHNIQUE: Multidetector CT imaging of the left knee was performed according to the standard protocol. Multiplanar CT image reconstructions were also generated.  COMPARISON:  None.  FINDINGS: There is a mildly comminuted and significantly displaced fracture of the distal femoral diaphysis. This fracture demonstrates more than 1 shaft width of posterior displacement and mild overriding of  the fracture fragments. There is a nondisplaced oblique fracture of the distal femur demonstrating intercondylar extension. This fracture exits from the medial metaphyseal cortex. The fracture appears open anteriorly.  Fragmentation of the patella laterally has sharp margins and is suspicious for a nondisplaced fracture rather than a bipartite patella.  The proximal fibula and proximal tibia are intact.  Large left knee lipohemarthrosis is noted. Intra-articular air is also present. There is multifocal soft tissue emphysema within the central left thigh musculature, proximal extent incompletely visualized. The distal femoral and popliteal arteries appear grossly intact on non contrast imaging. The quadriceps tendon is intact, although is posteriorly buckled at the level of the distal femoral diaphyseal fracture. There is some laxity of the patellar tendon. The cruciate and collateral ligaments appear intact, and there is no dislocation at the knee.  IMPRESSION: 1. Comminuted open fracture of the distal femur with significant posterior displacement as described. There is intra-articular extension of the fracture into the intercondylar region. 2. Suspected nondisplaced fracture of the patella laterally. 3. The open femoral fracture is associated with intra-articular air as well as soft tissue emphysema in the distal thigh. Patient is at risk for intra-articular infection. 4. Possible injury of the quadriceps tendon near the musculotendinous junction without demonstrated complete rupture.   Electronically Signed   By: Carey Bullocks M.D.   On: 02/07/2015 16:59   Ct Abdomen Pelvis W Contrast  02/07/2015   CLINICAL DATA:  Level 1 trauma, patient fell 30 feet through the roof of a warehouse and landed face first onto concrete. Facial pain, left wrist pain, left knee pain. History of aortic valve stenosis and mitral valve stenosis and prior cardiac surgery.  EXAM:  CT CHEST, ABDOMEN AND PELVIS WITHOUT CONTRAST   TECHNIQUE: Multidetector CT imaging of the chest, abdomen and pelvis was performed following the standard protocol without IV contrast.  COMPARISON:  Radiographs from 02/07/2015  FINDINGS: The patient was scanned with his arms by his sides, introducing streak artifact which can reduce sensitivity and specificity in assessing the upper abdominal organs.  CT CHEST FINDINGS  Mediastinum/Nodes: Dilated ascending aorta at 4 cm. Postoperative findings along the aortic valve. Anterior mediastinal density is probably either postoperative or due to thymus. I am less suspicious of this being acute hemorrhage based on the morphology and appearance.  No pathologic thoracic adenopathy. I do not observe an aortic dissection  Lungs/Pleura: No pneumothorax or pneumomediastinum. Subsegmental atelectasis posteriorly in the left lower lobe.  Musculoskeletal: No thoracic fracture identified. Multilevel Schmorl's nodes without kyphosis.  CT ABDOMEN PELVIS FINDINGS  Hepatobiliary: Unremarkable  Pancreas: Unremarkable  Spleen: Unremarkable  Adrenals/Urinary Tract: Unremarkable  Stomach/Bowel: Unremarkable  Vascular/Lymphatic: No vascular dissection or active bleeding is identified.  Reproductive: Tissue planes around the left side of the prostate gland are somewhat obscured due to hematoma.  Other: No ascites identified.  Musculoskeletal: Acute fracture, left sacrum, primarily involving the anterior portion of the left sacral ala and extending into the anterior portion of the left sacroiliac joint with some mild widening of the anterior portion of the left SI joint. The adjacent hematoma in the left presacral space with stranding tracking around the superior gluteal artery but without direct extravasation observed, and with hematoma tracking primarily at along the left piriformis muscle but also mildly expanding the left obturator internus. There stranding around the left sciatic nerve as it exits through the sciatic foramen.  As shown on  conventional radiography, there are also left pubic rami fractures. These consist of a medial superior ramus fracture extending into the pubic body and upper margin of the pubic symphysis, as well as a mildly comminuted inferior pubic ramus fracture in the midportion of the inferior ramus. Indistinctness of adjacent tissue planes along the medial margins of the left hip adductor musculature noted. Again, no active bleeding is identified. No extension into the left acetabulum is observed. There is a small amount of hemorrhage in the left anterior states of right CS. The regional hematoma does not appear to be causing a great deal of impingement at the operator foramen.  IMPRESSION: 1. Acute pelvic fractures include the anterior upper left sacrum (extending into the left SI joint) ; the midportion of the inferior pubic ramus ; and the medial portion of the superior pubic ramus extending into the left pubic body. There is adjacent hematoma tracking in the vicinity of these fractures and a small amount of hematoma in the left anterior space of right CS, but we do not demonstrate active extravasation of contrast to suggest active bleeding. Although there is hematoma tracking around the left operator internus and left piriformis muscles, the degree of impingement at the operator foramen and sciatic notch appears currently minimal. 2. 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 I suspect this is a chronic finding in this patient. There is some adjacent anterior mediastinal density which is probably postoperative in considered less likely to be due to active mediastinal hematoma given the small and bandlike orientation. I do not observe a dissection.   Electronically Signed   By: Gaylyn Rong M.D.   On: 02/07/2015 16:59   Dg Pelvis Portable  02/07/2015   CLINICAL DATA:  Fall for roof, about  30 feet. Femur and forearm fractures.  EXAM: PORTABLE PELVIS 1-2 VIEWS  COMPARISON:  None.   FINDINGS: Left lateral iliac crest and anterior iliac spine excluded due to difficulty with patient positioning.  Acute fractures of the left pubic rami.  IMPRESSION: 1. Acute fractures of the left pubic rami. There is a high degree of likelihood of an otherwise occult sacral fracture based on this pattern of injury; this can be assessed on the patient's planned pelvis CT.   Electronically Signed   By: Gaylyn Rong M.D.   On: 02/07/2015 16:17   Dg Chest Portable 1 View  02/07/2015   CLINICAL DATA:  Fall 30 feet, open left femoral fracture  EXAM: PORTABLE CHEST - 1 VIEW  COMPARISON:  None.  FINDINGS: Evidence of median sternotomy. Mild prominence of the cardiomediastinal silhouette is noted which may be in part due to AP portable technique. Visualized lung fields are clear. No displaced rib fracture identified.  IMPRESSION: Borderline enlargement of the cardiomediastinal silhouette without focal acute finding.   Electronically Signed   By: Christiana Pellant M.D.   On: 02/07/2015 16:16   Ct Maxillofacial Wo Cm  02/07/2015   CLINICAL DATA:  Larey Seat from 30 feet. Landed face first. Jaw pain. Initial encounter.  EXAM: CT HEAD WITHOUT CONTRAST  CT MAXILLOFACIAL WITHOUT CONTRAST  CT CERVICAL SPINE WITHOUT CONTRAST  TECHNIQUE: Multidetector CT imaging of the head, cervical spine, and maxillofacial structures were performed using the standard protocol without intravenous contrast. Multiplanar CT image reconstructions of the cervical spine and maxillofacial structures were also generated.  COMPARISON:  Chest and pelvic films of same date.  FINDINGS: CT HEAD FINDINGS  Sinuses/Soft tissues: Soft tissue swelling and probable laceration about the left frontal scalp. Clear paranasal sinuses and mastoid air cells. No skull fracture. Clear mastoid air cells.  Intracranial: No mass lesion, hemorrhage, hydrocephalus, acute infarct, intra-axial, or extra-axial fluid collection.  CT MAXILLOFACIAL FINDINGS  Soft tissues: Soft tissue  swelling and gas about the left side of the mandible. Normal appearance of the orbits and globes, without retrobulbar hemorrhage.  Bones: Comminuted fracture of the body of the left side of the mandible. Minimally displaced. Adjacent tooth fractures.  Minimally displaced, minimally comminuted fracture of the right mandibular condyle. Example image 38 of series 3. The condyle may be minimally subluxed anteriorly.  Fractures of multiple right-sided mandibular teeth. Teeth 29 through 31. At least 1 right maxillary to fracture, including on image 32 of series 3. Tooth 5.  Zygomatic arches are intact. No fluid in the paranasal sinuses or mastoid air cells. Pterygoid plates intact. Mucosal thickening of the right maxillary sinus.  Coronal reformats demonstrate intact orbital floors.  CT CERVICAL SPINE FINDINGS  Spinal visualization through the bottom of C5 on the initial exam. From the mid C6 level inferiorly are not well evaluated secondary to motion. Prevertebral soft tissues are within normal limits. No apical pneumothorax.  Skull base intact. Maintenance of vertebral body height through the C5 level. Facets are well-aligned. Coronal reformats demonstrate a normal C1-C2 articulation.  Subsequent repeat imaging includes from C6 through the bottom of T1. No prevertebral soft tissue swelling across these levels. No fracture or subluxation identified.  IMPRESSION: 1. Left frontal scalp soft tissue swelling, without acute intracranial abnormality. 2. Left mandibular body comminuted fracture. Right mandibular condyle fracture with suggestion of subluxation. Bilateral fractured teeth. 3. No acute findings within the cervical spine.   Electronically Signed   By: Jeronimo Greaves M.D.   On: 02/07/2015 17:07  Review of Systems  Unable to perform ROS: acuity of condition  Eyes: Negative for blindness.  Cardiovascular: Negative for chest pain.  Gastrointestinal: Positive for nausea. Negative for vomiting and abdominal pain.    Musculoskeletal: Positive for back pain. Negative for neck pain.  Neurological: Positive for loss of consciousness. Negative for seizures and headaches.    Blood pressure 116/64, pulse 61, temperature 98.8 F (37.1 C), temperature source Oral, resp. rate 21, height 6\' 1"  (1.854 m), weight 115.667 kg (255 lb), SpO2 100 %. Physical Exam  Vitals reviewed. Constitutional: He is oriented to person, place, and time. Vital signs are normal. He appears well-developed and well-nourished. He is cooperative. No distress. He is not intubated. Cervical collar and nasal cannula in place.  HENT:  Head: Normocephalic. Head is with laceration. Head is without raccoon's eyes, without Battle's sign, without abrasion and without contusion.    Right Ear: Hearing, tympanic membrane, external ear and ear canal normal. No lacerations. No drainage or tenderness. No foreign bodies. Tympanic membrane is not perforated. No hemotympanum.  Left Ear: Hearing, tympanic membrane, external ear and ear canal normal. No lacerations. No drainage or tenderness. No foreign bodies. Tympanic membrane is not perforated. No hemotympanum.  Nose: Nose normal. No nose lacerations, sinus tenderness, nasal deformity or nasal septal hematoma. No epistaxis.  Mouth/Throat: Uvula is midline, oropharynx is clear and moist and mucous membranes are normal. Abnormal dentition. No lacerations.  Dried blood on both ears; shallow L forehead lac; L chin laceration; b/l jaw pain (L>r); some bleeding from around several lower teeth  Eyes: Conjunctivae, EOM and lids are normal. Pupils are equal, round, and reactive to light. No scleral icterus.  Neck: Trachea normal. No JVD present. No spinous process tenderness and no muscular tenderness present. Carotid bruit is not present. No tracheal deviation present.    +collar  Cardiovascular: Normal rate, regular rhythm, normal heart sounds, intact distal pulses and normal pulses.   Respiratory: Effort normal  and breath sounds normal. No accessory muscle usage or stridor. No tachypnea. He is not intubated. No respiratory distress. He exhibits no tenderness, no bony tenderness, no laceration and no crepitus.    GI: Soft. Normal appearance. He exhibits no distension. Bowel sounds are decreased. There is no tenderness. There is no rigidity, no rebound, no guarding and no CVA tenderness.  Genitourinary: Penis normal.  Musculoskeletal: Normal range of motion. He exhibits no edema.       Left wrist: He exhibits tenderness, bony tenderness and swelling.       Left knee: He exhibits effusion, deformity and laceration.       Left upper leg: He exhibits bony tenderness and swelling.  L wrist swelling/deformity; palp rad; gross sensation/movement intact; distal L femur/knee swelling with laceration, TTP; +plantar/dorsiflexion; gross sensation intact  Lymphadenopathy:    He has no cervical adenopathy.  Neurological: He is alert and oriented to person, place, and time. He has normal strength. No cranial nerve deficit or sensory deficit. GCS eye subscore is 4. GCS verbal subscore is 5. GCS motor subscore is 6.  Skin: Skin is warm and dry. Abrasion and laceration noted. He is not diaphoretic.  Psychiatric: He has a normal mood and affect. His speech is normal and behavior is normal.     Assessment/Plan Fall Closed head injury Forehead laceration B/l mandibular fractures Multiple fractured teeth L chin laceration Open comminuted L distal femur L patella fx L distal radius fx L sacral ala fx into SI joint Pelvic hematoma H/o AV  replacement L sup/inf pubic rami fracture  Facial trauma consult - Dr Lazarus Salines - ED resident spoke with - to see in OR holding Ortho consult - Dr Eulah Pont - to OR for open L femur, ancef given, tetanus UTD; will also address wrist and pelvic fxs SDU after OR Maintain C spine precautions DVT prophylaxis  Mary Sella. Andrey Campanile, MD, FACS General, Bariatric, & Minimally Invasive  Surgery [facility], [place]    [facility] M 02/07/2015, 5:43 PM   Procedures

## 2015-02-07 NOTE — Op Note (Signed)
02/07/2015  11:07 PM  PATIENT:  [name] [name]    PRE-OPERATIVE DIAGNOSIS:  femur fx  POST-OPERATIVE DIAGNOSIS:  Same  PROCEDURE:  OPEN REDUCTION INTERNAL FIXATION (ORIF) PROXIMAL RADIAL FRACTURE, OPEN REDUCTION INTERNAL FIXATION (ORIF) DISTAL FEMUR FRACTURE, CLOSED REDUCTION RADIAL SHAFT, Wrist, CLOSED REDUCTION MANDIBLE WITH MANDIBULOMAXILLARY FUSION, FACIAL LACERATION REPAIR  SURGEON:  Mabel Unrein D, MD  ASSISTANT: Lovett Calender, PA-C, She was present and scrubbed throughout the case, critical for completion in a timely fashion, and for retraction, instrumentation, and closure.   ANESTHESIA:   gen  PREOPERATIVE INDICATIONS:  [name] [name] is a  19 y.o. male with a diagnosis of femur fx who failed conservative measures and elected for surgical management.    The risks benefits and alternatives were discussed with the patient preoperatively including but not limited to the risks of infection, bleeding, nerve injury, cardiopulmonary complications, the need for revision surgery, among others, and the patient was willing to proceed.  OPERATIVE IMPLANTS: headless screw, t-plate, biomet distal femur plate  OPERATIVE FINDINGS: multiple injuries  BLOOD LOSS: 315  COMPLICATIONS: none  TOURNIQUET TIME: 30mn  OPERATIVE PROCEDURE:  Patient was identified in the preoperative holding area and site was marked by me He was transported to the operating theater and placed on the table in supine position taking care to pad all bony prominences. After a preincinduction time out anesthesia was induced. The left lower and left upper extremity was prepped and draped in normal sterile fashion and a pre-incision timeout was performed. He received ancef for preoperative antibiotics.   Please see Dr. WCooper Rendernote for face and mandible portion of the case  First turned my attention to his distal femur fracture I extended his traumatic wounds proximally and distally and delivered fracture  ends.  I thoroughly irrigated these with 6 L of saline and debrided any necrotic tissue there was no foreign material noted. Debridement included the muscle fascia bone.  Next I extended the incision further distally elevated the patella and examined the articular surface there was a split that was stepped off.  I reduced the split and pinned it into place.  Next I reduced this articular block to the shaft and pinned it into place as well and placed multiple lag screws as there was some comminution. I then selected a I met distal femur plate and pinned it to the shaft it was slightly anterior but was still below the articular surface I elected to keep it in this place as I was very happy with the reduction.  First I placed a proximal screw followed by lag screws across the articular split and was happy with the reduction here as well. I then placed the remaining distal screws and locked these into place using the locking bolts.  I placed a total of 4 shaft screws with excellent purchase.  I then thoroughly irrigated the wounds again closed his joint capsule and closed the skin with a simple stitch.  Next I turned my attention to his left upper extremity where I performed an approach to his radial head. I incised the muscle muscle in line with its fibers of his extensor bundle and then incised the lateral joint capsule for later repair. Identified his fracture site.  First I noted 1 articular split I reduced this and placed a headless cannulated screw with multiple x-rays to confirm appropriate placement and this effectively reduced the articular surface. I then custom made a Y plate and placed it in the safe zone on the shaft  and articular block.  As happy with the reduction and took multiple x-rays and was happy with all this.  Next I thoroughly irrigated I closed the capsule as well as his muscle fascia and skin with a nylon stitch. I placed a sterile dressing we then turned our attention to  his distal radius which was significantly displaced. I performed a closed reduction of his distal radius maintain alignment of his 2 carpal fractures and placed a double sugar tong splint.  He was then awoken and taken the PACU in stable condition.  POST OPERATIVE PLAN: NWB LUE, LLE    This note was generated using a template and dragon dictation system. In light of that, I have reviewed the note and all aspects of it are applicable to this case. Any dictation errors are due to the computerized dictation system.

## 2015-02-07 NOTE — Transfer of Care (Signed)
Immediate Anesthesia Transfer of Care Note  Patient: [name] [name]  Procedure(s) Performed: Procedure(s): OPEN REDUCTION INTERNAL FIXATION (ORIF) PROXIMAL RADIAL FRACTURE (Left) OPEN REDUCTION INTERNAL FIXATION (ORIF) DISTAL FEMUR FRACTURE CLOSED REDUCTION RADIAL SHAFT, Wrist (Left) CLOSED REDUCTION MANDIBLE WITH MANDIBULOMAXILLARY FUSION (Left) FACIAL LACERATION REPAIR  Patient Location: PACU  Anesthesia Type:General  Level of Consciousness: awake  Airway & Oxygen Therapy: Patient Spontanous Breathing and Patient connected to face mask oxygen  Post-op Assessment: Report given to RN and Post -op Vital signs reviewed and stable  Post vital signs: Reviewed and stable  Last Vitals:  Filed Vitals:   02/07/15 1715  BP: 116/64  Pulse: 61  Temp:   Resp: 21    Complications: No apparent anesthesia complications

## 2015-02-07 NOTE — ED Notes (Signed)
I Stat Lactic Acid results shown to Dr. Durward FortesNickle

## 2015-02-07 NOTE — Progress Notes (Signed)
Lurena Joinerebecca, CRNA at bedside. I spoke to father and patient also spoke to father. At request of patient I give his job foreman Onalee HuaDavid his father's phone number to contact and give directions to hospital. When I spoke to father he advised he did not have GPS. Onalee HuaDavid the [employer] stated he would keep contact with father and ensure he could get to hospital

## 2015-02-07 NOTE — Consult Note (Signed)
ORTHOPAEDIC CONSULTATION  REQUESTING PHYSICIAN: No att. providers found  Chief Complaint: fall from 57f  HPI: FFerman [name] a 19y.o. 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 He was knocked unconscious. So far injuries include open left femur fracture, mandibular injuries, left radial head fracture left distal radius fracture left capitate fracture left triquetrum fracture, left LC 1/2 pelvis fracture  Past Medical History  Diagnosis Date  . Aortic valve stenosis   . Mitral valve stenosis    Past Surgical History  Procedure Laterality Date  . Exploration post operative open heart      x 2  . 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 Marital Status: Single    Spouse Name: N/A  . Number of Children: N/A  . Years of Education: N/A   Social History Main Topics  . Smoking status: Never Smoker   . Smokeless tobacco: Current User  . Alcohol Use: Not on file  . Drug Use: Not on file  . Sexual Activity: Not on file   Other Topics Concern  . None   Social History Narrative  . None   History reviewed. No pertinent family history. No Known Allergies Prior to Admission medications   Not on File   Dg Forearm Left  02/07/2015   CLINICAL DATA:  Fall through roof from 30 foot height. Left forearm pain and physical deformity. Initial encounter.  EXAM: LEFT FOREARM - 2 VIEW  COMPARISON:  None.  FINDINGS: A comminuted fracture of the distal radius is seen, with involvement of the radiocarpal and distal radial ulnar joints. There is posterior displacement and angulation of the distal radial fracture fragments.  In addition, there is a comminuted fracture of the radial head. No ulnar fracture identified.  IMPRESSION: Comminuted distal radius fracture with dorsal displacement and angulation.  Comminuted fracture of the radial head. Dedicated elbow radiographs recommended for further evaluation.   Electronically  Signed   By: [name] GellM.D.   On: 02/07/2015 17:35   Dg Wrist Complete Left  02/07/2015   CLINICAL DATA:  [name] Circlewhile working on roof.  EXAM: LEFT WRIST - COMPLETE 3+ VIEW  COMPARISON:  None.  FINDINGS: There is a comminuted intra-articular fracture of the distal radius with marked dorsal displacement, impaction and dorsal angulation. There also is a fracture across the midportion of the triquetrum, minimally displaced. There is a transverse nondisplaced fracture across the capitate. At the radial aspect of the wrist, the scaphoid, trapezoid and trapezium are not well seen on this study due to positioning limitations and rotation.  IMPRESSION: 1. Comminuted intra-articular fracture of the distal radius with marked impaction, displacement and angulation. 2. Transverse fractures across the triquetrum and capitate 3. Limited visibility of the bones at the radial aspect of the wrist.   Electronically Signed   By: [name] NewportM.D.   On: 02/07/2015 17:38   Ct Head Wo Contrast  02/07/2015   CLINICAL DATA:  [name] Circlefrom 30 feet. Landed face first. Jaw pain. Initial encounter.  EXAM: CT HEAD WITHOUT CONTRAST  CT MAXILLOFACIAL WITHOUT CONTRAST  CT CERVICAL SPINE WITHOUT CONTRAST  TECHNIQUE: Multidetector CT imaging of the head, cervical spine, and maxillofacial structures were performed using the standard protocol without intravenous contrast. Multiplanar CT image reconstructions of the cervical spine and maxillofacial structures were also generated.  COMPARISON:  Chest and pelvic films of same  date.  FINDINGS: CT HEAD FINDINGS  Sinuses/Soft tissues: Soft tissue swelling and probable laceration about the left frontal scalp. Clear paranasal sinuses and mastoid air cells. No skull fracture. Clear mastoid air cells.  Intracranial: No mass lesion, hemorrhage, hydrocephalus, acute infarct, intra-axial, or extra-axial fluid collection.  CT MAXILLOFACIAL FINDINGS  Soft tissues: Soft tissue swelling and gas about the left  side of the mandible. Normal appearance of the orbits and globes, without retrobulbar hemorrhage.  Bones: Comminuted fracture of the body of the left side of the mandible. Minimally displaced. Adjacent tooth fractures.  Minimally displaced, minimally comminuted fracture of the right mandibular condyle. Example image 38 of series 3. The condyle may be minimally subluxed anteriorly.  Fractures of multiple right-sided mandibular teeth. Teeth 29 through 31. At least 1 right maxillary to fracture, including on image 32 of series 3. Tooth 5.  Zygomatic arches are intact. No fluid in the paranasal sinuses or mastoid air cells. Pterygoid plates intact. Mucosal thickening of the right maxillary sinus.  Coronal reformats demonstrate intact orbital floors.  CT CERVICAL SPINE FINDINGS  Spinal visualization through the bottom of C5 on the initial exam. From the mid C6 level inferiorly are not well evaluated secondary to motion. Prevertebral soft tissues are within normal limits. No apical pneumothorax.  Skull base intact. Maintenance of vertebral body height through the C5 level. Facets are well-aligned. Coronal reformats demonstrate a normal C1-C2 articulation.  Subsequent repeat imaging includes from C6 through the bottom of T1. No prevertebral soft tissue swelling across these levels. No fracture or subluxation identified.  IMPRESSION: 1. Left frontal scalp soft tissue swelling, without acute intracranial abnormality. 2. Left mandibular body comminuted fracture. Right mandibular condyle fracture with suggestion of subluxation. Bilateral fractured teeth. 3. No acute findings within the cervical spine.   Electronically Signed   By: [name] [name] M.D.   On: 02/07/2015 17:07   Ct Chest W Contrast  02/07/2015   CLINICAL DATA:  Level 1 trauma, patient fell 30 feet through the roof of a warehouse and landed face first onto concrete. Facial pain, left wrist pain, left knee pain. History of aortic valve stenosis and mitral valve  stenosis and prior cardiac surgery.  EXAM: CT CHEST, ABDOMEN AND PELVIS WITHOUT CONTRAST  TECHNIQUE: Multidetector CT imaging of the chest, abdomen and pelvis was performed following the standard protocol without IV contrast.  COMPARISON:  Radiographs from 02/07/2015  FINDINGS: The patient was scanned with his arms by his sides, introducing streak artifact which can reduce sensitivity and specificity in assessing the upper abdominal organs.  CT CHEST FINDINGS  Mediastinum/Nodes: Dilated ascending aorta at 4 cm. Postoperative findings along the aortic valve. Anterior mediastinal density is probably either postoperative or due to thymus. I am less suspicious of this being acute hemorrhage based on the morphology and appearance.  No pathologic thoracic adenopathy. I do not observe an aortic dissection  Lungs/Pleura: No pneumothorax or pneumomediastinum. Subsegmental atelectasis posteriorly in the left lower lobe.  Musculoskeletal: No thoracic fracture identified. Multilevel Schmorl's nodes without kyphosis.  CT ABDOMEN PELVIS FINDINGS  Hepatobiliary: Unremarkable  Pancreas: Unremarkable  Spleen: Unremarkable  Adrenals/Urinary Tract: Unremarkable  Stomach/Bowel: Unremarkable  Vascular/Lymphatic: No vascular dissection or active bleeding is identified.  Reproductive: Tissue planes around the left side of the prostate gland are somewhat obscured due to hematoma.  Other: No ascites identified.  Musculoskeletal: Acute fracture, left sacrum, primarily involving the anterior portion of the left sacral ala and extending into the anterior portion of the left sacroiliac joint  with some mild widening of the anterior portion of the left SI joint. The adjacent hematoma in the left presacral space with stranding tracking around the superior gluteal artery but without direct extravasation observed, and with hematoma tracking primarily at along the left piriformis muscle but also mildly expanding the left obturator internus. There  stranding around the left sciatic nerve as it exits through the sciatic foramen.  As shown on conventional radiography, there are also left pubic rami fractures. These consist of a medial superior ramus fracture extending into the pubic body and upper margin of the pubic symphysis, as well as a mildly comminuted inferior pubic ramus fracture in the midportion of the inferior ramus. Indistinctness of adjacent tissue planes along the medial margins of the left hip adductor musculature noted. Again, no active bleeding is identified. No extension into the left acetabulum is observed. There is a small amount of hemorrhage in the left anterior states of right CS. The regional hematoma does not appear to be causing a great deal of impingement at the operator foramen.  IMPRESSION: 1. Acute pelvic fractures include the anterior upper left sacrum (extending into the left SI joint) ; the midportion of the inferior pubic ramus ; and the medial portion of the superior pubic ramus extending into the left pubic body. There is adjacent hematoma tracking in the vicinity of these fractures and a small amount of hematoma in the left anterior space of right CS, but we do not demonstrate active extravasation of contrast to suggest active bleeding. Although there is hematoma tracking around the left operator internus and left piriformis muscles, the degree of impingement at the operator foramen and sciatic notch appears currently minimal. 2. 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 I suspect this is a chronic finding in this patient. There is some adjacent anterior mediastinal density which is probably postoperative in considered less likely to be due to active mediastinal hematoma given the small and bandlike orientation. I do not observe a dissection.   Electronically Signed   By: Van Clines M.D.   On: 02/07/2015 16:59   Ct Cervical Spine Wo Contrast  02/07/2015   CLINICAL DATA:  [name] [name]  from 30 feet. Landed face first. Jaw pain. Initial encounter.  EXAM: CT HEAD WITHOUT CONTRAST  CT MAXILLOFACIAL WITHOUT CONTRAST  CT CERVICAL SPINE WITHOUT CONTRAST  TECHNIQUE: Multidetector CT imaging of the head, cervical spine, and maxillofacial structures were performed using the standard protocol without intravenous contrast. Multiplanar CT image reconstructions of the cervical spine and maxillofacial structures were also generated.  COMPARISON:  Chest and pelvic films of same date.  FINDINGS: CT HEAD FINDINGS  Sinuses/Soft tissues: Soft tissue swelling and probable laceration about the left frontal scalp. Clear paranasal sinuses and mastoid air cells. No skull fracture. Clear mastoid air cells.  Intracranial: No mass lesion, hemorrhage, hydrocephalus, acute infarct, intra-axial, or extra-axial fluid collection.  CT MAXILLOFACIAL FINDINGS  Soft tissues: Soft tissue swelling and gas about the left side of the mandible. Normal appearance of the orbits and globes, without retrobulbar hemorrhage.  Bones: Comminuted fracture of the body of the left side of the mandible. Minimally displaced. Adjacent tooth fractures.  Minimally displaced, minimally comminuted fracture of the right mandibular condyle. Example image 38 of series 3. The condyle may be minimally subluxed anteriorly.  Fractures of multiple right-sided mandibular teeth. Teeth 29 through 31. At least 1 right maxillary to fracture, including on image 32 of series 3. Tooth 5.  Zygomatic arches are  intact. No fluid in the paranasal sinuses or mastoid air cells. Pterygoid plates intact. Mucosal thickening of the right maxillary sinus.  Coronal reformats demonstrate intact orbital floors.  CT CERVICAL SPINE FINDINGS  Spinal visualization through the bottom of C5 on the initial exam. From the mid C6 level inferiorly are not well evaluated secondary to motion. Prevertebral soft tissues are within normal limits. No apical pneumothorax.  Skull base intact.  Maintenance of vertebral body height through the C5 level. Facets are well-aligned. Coronal reformats demonstrate a normal C1-C2 articulation.  Subsequent repeat imaging includes from C6 through the bottom of T1. No prevertebral soft tissue swelling across these levels. No fracture or subluxation identified.  IMPRESSION: 1. Left frontal scalp soft tissue swelling, without acute intracranial abnormality. 2. Left mandibular body comminuted fracture. Right mandibular condyle fracture with suggestion of subluxation. Bilateral fractured teeth. 3. No acute findings within the cervical spine.   Electronically Signed   By: [name] [name] M.D.   On: 02/07/2015 17:07   Ct Knee Left Wo Contrast  02/07/2015   CLINICAL DATA:  Level 1 trauma. Pull-through where house roof. Evaluate distal femur fracture. Initial encounter.  EXAM: CT OF THE LEFT KNEE WITHOUT CONTRAST  TECHNIQUE: Multidetector CT imaging of the left knee was performed according to the standard protocol. Multiplanar CT image reconstructions were also generated.  COMPARISON:  None.  FINDINGS: There is a mildly comminuted and significantly displaced fracture of the distal femoral diaphysis. This fracture demonstrates more than 1 shaft width of posterior displacement and mild overriding of the fracture fragments. There is a nondisplaced oblique fracture of the distal femur demonstrating intercondylar extension. This fracture exits from the medial metaphyseal cortex. The fracture appears open anteriorly.  Fragmentation of the patella laterally has sharp margins and is suspicious for a nondisplaced fracture rather than a bipartite patella.  The proximal fibula and proximal tibia are intact.  Large left knee lipohemarthrosis is noted. Intra-articular air is also present. There is multifocal soft tissue emphysema within the central left thigh musculature, proximal extent incompletely visualized. The distal femoral and popliteal arteries appear grossly intact on non  contrast imaging. The quadriceps tendon is intact, although is posteriorly buckled at the level of the distal femoral diaphyseal fracture. There is some laxity of the patellar tendon. The cruciate and collateral ligaments appear intact, and there is no dislocation at the knee.  IMPRESSION: 1. Comminuted open fracture of the distal femur with significant posterior displacement as described. There is intra-articular extension of the fracture into the intercondylar region. 2. Suspected nondisplaced fracture of the patella laterally. 3. The open femoral fracture is associated with intra-articular air as well as soft tissue emphysema in the distal thigh. Patient is at risk for intra-articular infection. 4. Possible injury of the quadriceps tendon near the musculotendinous junction without demonstrated complete rupture.   Electronically Signed   By: Richardean Sale M.D.   On: 02/07/2015 16:59   Ct Abdomen Pelvis W Contrast  02/07/2015   CLINICAL DATA:  Level 1 trauma, patient fell 30 feet through the roof of a warehouse and landed face first onto concrete. Facial pain, left wrist pain, left knee pain. History of aortic valve stenosis and mitral valve stenosis and prior cardiac surgery.  EXAM: CT CHEST, ABDOMEN AND PELVIS WITHOUT CONTRAST  TECHNIQUE: Multidetector CT imaging of the chest, abdomen and pelvis was performed following the standard protocol without IV contrast.  COMPARISON:  Radiographs from 02/07/2015  FINDINGS: The patient was scanned with his arms by his  sides, introducing streak artifact which can reduce sensitivity and specificity in assessing the upper abdominal organs.  CT CHEST FINDINGS  Mediastinum/Nodes: Dilated ascending aorta at 4 cm. Postoperative findings along the aortic valve. Anterior mediastinal density is probably either postoperative or due to thymus. I am less suspicious of this being acute hemorrhage based on the morphology and appearance.  No pathologic thoracic adenopathy. I do not  observe an aortic dissection  Lungs/Pleura: No pneumothorax or pneumomediastinum. Subsegmental atelectasis posteriorly in the left lower lobe.  Musculoskeletal: No thoracic fracture identified. Multilevel Schmorl's nodes without kyphosis.  CT ABDOMEN PELVIS FINDINGS  Hepatobiliary: Unremarkable  Pancreas: Unremarkable  Spleen: Unremarkable  Adrenals/Urinary Tract: Unremarkable  Stomach/Bowel: Unremarkable  Vascular/Lymphatic: No vascular dissection or active bleeding is identified.  Reproductive: Tissue planes around the left side of the prostate gland are somewhat obscured due to hematoma.  Other: No ascites identified.  Musculoskeletal: Acute fracture, left sacrum, primarily involving the anterior portion of the left sacral ala and extending into the anterior portion of the left sacroiliac joint with some mild widening of the anterior portion of the left SI joint. The adjacent hematoma in the left presacral space with stranding tracking around the superior gluteal artery but without direct extravasation observed, and with hematoma tracking primarily at along the left piriformis muscle but also mildly expanding the left obturator internus. There stranding around the left sciatic nerve as it exits through the sciatic foramen.  As shown on conventional radiography, there are also left pubic rami fractures. These consist of a medial superior ramus fracture extending into the pubic body and upper margin of the pubic symphysis, as well as a mildly comminuted inferior pubic ramus fracture in the midportion of the inferior ramus. Indistinctness of adjacent tissue planes along the medial margins of the left hip adductor musculature noted. Again, no active bleeding is identified. No extension into the left acetabulum is observed. There is a small amount of hemorrhage in the left anterior states of right CS. The regional hematoma does not appear to be causing a great deal of impingement at the operator foramen.  IMPRESSION:  1. Acute pelvic fractures include the anterior upper left sacrum (extending into the left SI joint) ; the midportion of the inferior pubic ramus ; and the medial portion of the superior pubic ramus extending into the left pubic body. There is adjacent hematoma tracking in the vicinity of these fractures and a small amount of hematoma in the left anterior space of right CS, but we do not demonstrate active extravasation of contrast to suggest active bleeding. Although there is hematoma tracking around the left operator internus and left piriformis muscles, the degree of impingement at the operator foramen and sciatic notch appears currently minimal. 2. 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 I suspect this is a chronic finding in this patient. There is some adjacent anterior mediastinal density which is probably postoperative in considered less likely to be due to active mediastinal hematoma given the small and bandlike orientation. I do not observe a dissection.   Electronically Signed   By: Van Clines M.D.   On: 02/07/2015 16:59   Dg Pelvis Portable  02/07/2015   CLINICAL DATA:  Fall for roof, about 30 feet. Femur and forearm fractures.  EXAM: PORTABLE PELVIS 1-2 VIEWS  COMPARISON:  None.  FINDINGS: Left lateral iliac crest and anterior iliac spine excluded due to difficulty with patient positioning.  Acute fractures of the left pubic rami.  IMPRESSION: 1.  Acute fractures of the left pubic rami. There is a high degree of likelihood of an otherwise occult sacral fracture based on this pattern of injury; this can be assessed on the patient's planned pelvis CT.   Electronically Signed   By: Van Clines M.D.   On: 02/07/2015 16:17   Dg Chest Portable 1 View  02/07/2015   CLINICAL DATA:  Fall 30 feet, open left femoral fracture  EXAM: PORTABLE CHEST - 1 VIEW  COMPARISON:  None.  FINDINGS: Evidence of median sternotomy. Mild prominence of the cardiomediastinal  silhouette is noted which may be in part due to AP portable technique. Visualized lung fields are clear. No displaced rib fracture identified.  IMPRESSION: Borderline enlargement of the cardiomediastinal silhouette without focal acute finding.   Electronically Signed   By: Conchita Paris M.D.   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 Open femur fracture.  EXAM: LEFT FEMUR 2 VIEWS  COMPARISON:  None.  FINDINGS: Left pubic ramus fractures noted.  Left sacral fracture noted.  Left distal radial metaphyseal oblique fracture observed with 4.3 cm posterior displacement of the distal fracture fragment with respect to the proximal, and the shaft of the proximal fragment clearing the skin surface. Gas tracks in these musculature posterior to the midshaft and down towards the fracture. There is mild comminution at the fracture site. Based on the frontal projection, vertical fracture extension in the distal fragment is noted extending into the intercondylar notch. There is some overlap at the fracture site.  There is also known fracture the lateral patella.  IMPRESSION: 1. Open fracture of the distal femur with the proximal shaft fragment extending to the skin surface, with mild comminution, and with a sagittally oriented extension from the main fracture site into the intercondylar notch. There is gas in the regional soft tissues and joint reflecting the open fracture. 2. The lateral patellar fracture is much better seen on today's CT scan. 3. Left pubic ramus and left sacral fractures.   Electronically Signed   By: Van Clines M.D.   On: 02/07/2015 17:43   Ct Maxillofacial Wo Cm  02/07/2015   CLINICAL DATA:  [name] [name] from 30 feet. Landed face first. Jaw pain. Initial encounter.  EXAM: CT HEAD WITHOUT CONTRAST  CT MAXILLOFACIAL WITHOUT CONTRAST  CT CERVICAL SPINE WITHOUT CONTRAST  TECHNIQUE: Multidetector CT imaging of the head, cervical  spine, and maxillofacial structures were performed using the standard protocol without intravenous contrast. Multiplanar CT image reconstructions of the cervical spine and maxillofacial structures were also generated.  COMPARISON:  Chest and pelvic films of same date.  FINDINGS: CT HEAD FINDINGS  Sinuses/Soft tissues: Soft tissue swelling and probable laceration about the left frontal scalp. Clear paranasal sinuses and mastoid air cells. No skull fracture. Clear mastoid air cells.  Intracranial: No mass lesion, hemorrhage, hydrocephalus, acute infarct, intra-axial, or extra-axial fluid collection.  CT MAXILLOFACIAL FINDINGS  Soft tissues: Soft tissue swelling and gas about the left side of the mandible. Normal appearance of the orbits and globes, without retrobulbar hemorrhage.  Bones: Comminuted fracture of the body of the left side of the mandible. Minimally displaced. Adjacent tooth fractures.  Minimally displaced, minimally comminuted fracture of the right mandibular condyle. Example image 38 of series 3. The condyle may be minimally subluxed anteriorly.  Fractures of multiple right-sided mandibular teeth. Teeth 29 through 31. At least 1 right maxillary to fracture, including  on image 32 of series 3. Tooth 5.  Zygomatic arches are intact. No fluid in the paranasal sinuses or mastoid air cells. Pterygoid plates intact. Mucosal thickening of the right maxillary sinus.  Coronal reformats demonstrate intact orbital floors.  CT CERVICAL SPINE FINDINGS  Spinal visualization through the bottom of C5 on the initial exam. From the mid C6 level inferiorly are not well evaluated secondary to motion. Prevertebral soft tissues are within normal limits. No apical pneumothorax.  Skull base intact. Maintenance of vertebral body height through the C5 level. Facets are well-aligned. Coronal reformats demonstrate a normal C1-C2 articulation.  Subsequent repeat imaging includes from C6 through the bottom of T1. No prevertebral soft  tissue swelling across these levels. No fracture or subluxation identified.  IMPRESSION: 1. Left frontal scalp soft tissue swelling, without acute intracranial abnormality. 2. Left mandibular body comminuted fracture. Right mandibular condyle fracture with suggestion of subluxation. Bilateral fractured teeth. 3. No acute findings within the cervical spine.   Electronically Signed   By: [name] [name] M.D.   On: 02/07/2015 17:07    Positive ROS: All other systems have been reviewed and were otherwise negative with the exception of those mentioned in the HPI and as above.  Labs cbc  Recent Labs  02/07/15 1542 02/07/15 1700  WBC  --  20.9*  HGB 17.7* 16.5  HCT 52.0 47.4  PLT  --  328    Labs inflam No results for input(s): CRP in the last 72 hours.  Invalid input(s): ESR  Labs coag  Recent Labs  02/07/15 1700  INR 1.16     Recent Labs  02/07/15 1542 02/07/15 1700  NA 142 139  K 2.9* 3.1*  CL 105 105  CO2  --  21  GLUCOSE 135* 124*  BUN 12 11  CREATININE 1.00 1.11  CALCIUM  --  10.1    Physical Exam: Filed Vitals:   02/07/15 1715  BP: 116/64  Pulse: 61  Temp:   Resp: 21   General: Alert, no acute distress Cardiovascular: No pedal edema Respiratory: No cyanosis, no use of accessory musculature GI: No organomegaly, abdomen is soft and non-tender Skin: No lesions in the area of chief complaint other than those listed below in MSK exam. Multiple abrasions Neurologic: Sensation intact distally Psychiatric: Patient is competent for consent with normal mood and affect Lymphatic: No axillary or cervical lymphadenopathy  MUSCULOSKELETAL:  LUE: Significant tenderness and deformity at his wrist with swelling and tenderness at his elbow. No pain at the shoulder. At his hand he has sensation intact in median radial and ulnar nerve distribution positive EPL FPL and interosseous muscle function warm and well-perfused digits. Compartments soft NLZ:JQBH M/R/U nerve, 2+ radial  pulse, +EPL/FPL/IO Compartments soft Painless ROM No Crepitous LLE: He has a 5 cm laceration over his anterior thigh no gross contamination. He has pain with any range of motion. His compartments are soft. Distally he has sensation intact to light touch good pulses and is able to wiggle his toes. RLE: SILT DP/SP/S/S/T nerve, 2+ DP, +TA/GS/EHL Compartments soft Painless ROM No Crepitous  Other extremities are atraumatic with painless ROM and NVI.  Assessment: Left carpal and distal radius fracture L radial head fracture Left open supra/intra condylar femur fracture Left LC1/2 pelvis fracture  Plan: OR emergently for irrigation debridement and fixation of his left femur  ORIF of his left radial head if stable  I discussed his wrist with Dr. Fredna Dow will treat this in the next few days. I  will prepare I will perform a closed reduction of the distal radius.  Likely nonoperative for the pelvis but I will discuss with Dr. Marcelino Scot.   Renette Butters, MD Cell [phone]   02/07/2015 6:28 PM

## 2015-02-07 NOTE — ED Notes (Signed)
Pt still in CT

## 2015-02-07 NOTE — Anesthesia Procedure Notes (Signed)
Procedure Name: Intubation Date/Time: 02/07/2015 6:49 PM Performed by: Brien MatesMAHONY, Glenyce Randle D Pre-anesthesia Checklist: Patient identified, Emergency Drugs available, Suction available, Patient being monitored and Timeout performed Patient Re-evaluated:Patient Re-evaluated prior to inductionOxygen Delivery Method: Circle system utilized Preoxygenation: Pre-oxygenation with 100% oxygen Intubation Type: IV induction, Rapid sequence and Cricoid Pressure applied Laryngoscope Size: Mac and 3 Grade View: Grade I Nasal Tubes: Right, Nasal Rae, Magill forceps- large, utilized and Nasal prep performed Tube size: 7.5 mm Number of attempts: 1 Placement Confirmation: ETT inserted through vocal cords under direct vision,  positive ETCO2 and breath sounds checked- equal and bilateral Secured at: 29 cm Tube secured with: Tape Dental Injury: Teeth and Oropharynx as per pre-operative assessment

## 2015-02-07 NOTE — Discharge Instructions (Signed)
Mandibular Fracture A mandibular fracture is a break in the jawbone. CAUSES  The most common cause of mandibular fracture is a direct blow (trauma) to the jaw. This could happen from:  A car crash.  Physical violence.  A fall from a high place. SYMPTOMS   Pain.  Swelling.  Difficulty and pain when closing the mouth.  Feeling that the teeth are not aligned properly when closing the mouth (malocclusion).  Difficulty speaking.  Difficulty swallowing. DIAGNOSIS  Your caregiver will take your history and perform a physical exam. He or she may also order imaging tests, such as X-rays or a computed tomography (CT) scan, to confirm your diagnosis. TREATMENT  Surgery is often needed to put the jaw back in the right position. Wires are usually placed around the teeth to hold the jaw in place while it heals. Treatment may also include pain medicine, ice, and a soft or liquid diet. HOME CARE INSTRUCTIONS   Put ice on the injured area.  Put ice in a plastic bag.  Place a towel between your skin and the bag.  Leave the ice on for 15-20 minutes, 03-04 times a day for the first 2 days.  Only take over-the-counter or prescription medicines for pain, discomfort, or fever as directed by your caregiver.  Eat a well-balanced, high-protein soft or liquid diet as directed by your caregiver.  If your jaws are wired, follow your caregiver's instructions for wired jaw care.  Sleep on your back to avoid putting pressure on your jaw.  Avoid exercising to the point that you become short of breath. SEEK MEDICAL CARE IF:   You have a severe headache or numbness in your face.  You have severe jaw pain that is not relieved with medicine.  Your jaw wires become loose.  You have uncontrollable nausea or anxiety.  Your swelling or redness gets worse. SEEK IMMEDIATE MEDICAL CARE IF:  You have a fever.  You have difficulty breathing.  You feel like your airway is tightening.  You cannot  swallow your saliva.  You make a high-pitched whistling sound when you breathe (wheezing). MAKE SURE YOU:   Understand these instructions.  Will watch your condition.  Will get help right away if you are not doing well or get worse. Document Released: 10/24/2005 Document Revised: 01/16/2012 Document Reviewed: 11/09/2011 [facility] Patient Information 2015 [place], [place]. This information is not intended to replace advice given to you by your health care provider. Make sure you discuss any questions you have with your health care provider.  Keep your head elevated 3-4 nights OK to brush your teeth with a soft toothbrush OK to gargle with mouthwash Follow the dietitian's advice to keep your calorie count adequate Activity tolerance will be dictated by Dr. Eliezer Lofts my office 1 week if you are still in town, [phone] Non-weight bearing in the Left arm, sling for comfort Non-weight bearing in the Left leg Keep splints clean and dry till follow-up   You may have to cut the wires if you need to vomit.  Keep the wire cutters on your person at all times.  There are 4 wire loops, or 8 total wires you will need to cut to loosen your jaws.  If you have to do this, the wires will have to be replaced fairly promptly, so call me or an ENT back home in [place]. The wires and screws can be removed around 6 weeks.  Your doctors will probably do an x-ray before they do this.  You have multiple partially broken teeth that will need dental attention as soon as the wires are removed.  Some oral surgeons and dentists may be comfortable removing the wires in which case you will not need to see another ENT doctor.   Call for breathing issues, signs of infection, vomiting or need to cut the wires. [phone] You are OK to shower.  There is skin glue on the cut on your LEFT forehead and LEFT chin. You can get this wet.  No ointment.  You can trim the edges of the glue as they start to peel up.There are no  stitches to be removed.

## 2015-02-07 NOTE — ED Provider Notes (Signed)
CSN: [id number]     Arrival date & time 02/07/15  1524 History   First MD Initiated Contact with Patient 02/07/15 1540     Chief Complaint  Patient presents with  . Trauma     (Consider location/radiation/quality/duration/timing/severity/associated sxs/prior Treatment) Patient is a 19 y.o. male presenting with fall. The history is provided by the EMS personnel. The history is limited by the condition of the patient (positive LOC and amnestic to event).  Fall This is a new problem. The current episode started today. The problem occurs constantly. The problem has been unchanged. Associated symptoms include arthralgias, chest pain and myalgias. Nothing aggravates the symptoms. He has tried nothing for the symptoms. The treatment provided no relief.    Past Medical History  Diagnosis Date  . Aortic valve stenosis   . Mitral valve stenosis    Past Surgical History  Procedure Laterality Date  . Exploration post operative open heart      x 2  . 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 No pertinent family history. History  Substance Use Topics  . Smoking status: Never Smoker   . Smokeless tobacco: Current User  . Alcohol Use: Not on file    Review of Systems  Respiratory: Negative for shortness of breath.   Cardiovascular: Positive for chest pain.  Musculoskeletal: Positive for myalgias and arthralgias.  All other systems reviewed and are negative.     Allergies  Review of patient's allergies indicates no known allergies.  Home Medications   Prior to Admission medications   Not on File   BP 116/64 mmHg  Pulse 61  Temp(Src) 98.8 F (37.1 C) (Oral)  Resp 21  Ht 6\' 1"  (1.854 m)  Wt 255 lb (115.667 kg)  BMI 33.65 kg/m2  SpO2 100% Physical Exam  Constitutional: He is oriented to person, place, and time. He appears well-developed. He appears distressed.  HENT:  Head: Normocephalic.  3 cm laceration to left forehead. Laceration to the left  angle of mandible. Multiple broken teeth with bruising.  Eyes: EOM are normal. Pupils are equal, round, and reactive to light.  Cardiovascular: Normal rate, regular rhythm, normal heart sounds and intact distal pulses.  Exam reveals no gallop and no friction rub.   No murmur heard. Pulmonary/Chest: Effort normal and breath sounds normal. No respiratory distress. He has no wheezes.  Abrasions to left chest  Abdominal: Soft. He exhibits no distension. There is no tenderness.  Musculoskeletal: He exhibits tenderness.  Visible deformities of the left distal wrist. 2+ radial pulse and intact motor and sensory function. Open distal left femur fracture just above the knee. Bone exposed. 2+ DP and PT pulses with intact motor and sensory function.  Neurological: He is alert and oriented to person, place, and time.  Skin: Skin is warm and dry. He is not diaphoretic.  Psychiatric: He has a normal mood and affect. His behavior is normal. Judgment and thought content normal.  Nursing note and vitals reviewed.   ED Course  Procedures (including critical care time) Labs Review Labs Reviewed  CBC - Abnormal; Notable for the following:    WBC 20.9 (*)    All other components within normal limits  I-STAT CHEM 8, ED - Abnormal; Notable for the following:    Potassium 2.9 (*)    Glucose, Bld 135 (*)    Hemoglobin 17.7 (*)    All other components within normal limits  I-STAT CG4 LACTIC ACID, ED - Abnormal;  Notable for the following:    Lactic Acid, Venous 3.53 (*)    All other components within normal limits  ETHANOL  PROTIME-INR  CDS SEROLOGY  COMPREHENSIVE METABOLIC PANEL  TYPE AND SCREEN  ABO/RH    Imaging Review Dg Forearm Left  02/07/2015   CLINICAL DATA:  Fall through roof from 30 foot height. Left forearm pain and physical deformity. Initial encounter.  EXAM: LEFT FOREARM - 2 VIEW  COMPARISON:  None.  FINDINGS: A comminuted fracture of the distal radius is seen, with involvement of the  radiocarpal and distal radial ulnar joints. There is posterior displacement and angulation of the distal radial fracture fragments.  In addition, there is a comminuted fracture of the radial head. No ulnar fracture identified.  IMPRESSION: Comminuted distal radius fracture with dorsal displacement and angulation.  Comminuted fracture of the radial head. Dedicated elbow radiographs recommended for further evaluation.   Electronically Signed   By: Myles RosenthalJohn  Stahl M.D.   On: 02/07/2015 17:35   Dg Wrist Complete Left  02/07/2015   CLINICAL DATA:  Larey SeatFell while working on roof.  EXAM: LEFT WRIST - COMPLETE 3+ VIEW  COMPARISON:  None.  FINDINGS: There is a comminuted intra-articular fracture of the distal radius with marked dorsal displacement, impaction and dorsal angulation. There also is a fracture across the midportion of the triquetrum, minimally displaced. There is a transverse nondisplaced fracture across the capitate. At the radial aspect of the wrist, the scaphoid, trapezoid and trapezium are not well seen on this study due to positioning limitations and rotation.  IMPRESSION: 1. Comminuted intra-articular fracture of the distal radius with marked impaction, displacement and angulation. 2. Transverse fractures across the triquetrum and capitate 3. Limited visibility of the bones at the radial aspect of the wrist.   Electronically Signed   By: Ellery Plunkaniel R Mitchell M.D.   On: 02/07/2015 17:38   Ct Head Wo Contrast  02/07/2015   CLINICAL DATA:  Larey SeatFell from 30 feet. Landed face first. Jaw pain. Initial encounter.  EXAM: CT HEAD WITHOUT CONTRAST  CT MAXILLOFACIAL WITHOUT CONTRAST  CT CERVICAL SPINE WITHOUT CONTRAST  TECHNIQUE: Multidetector CT imaging of the head, cervical spine, and maxillofacial structures were performed using the standard protocol without intravenous contrast. Multiplanar CT image reconstructions of the cervical spine and maxillofacial structures were also generated.  COMPARISON:  Chest and pelvic films of  same date.  FINDINGS: CT HEAD FINDINGS  Sinuses/Soft tissues: Soft tissue swelling and probable laceration about the left frontal scalp. Clear paranasal sinuses and mastoid air cells. No skull fracture. Clear mastoid air cells.  Intracranial: No mass lesion, hemorrhage, hydrocephalus, acute infarct, intra-axial, or extra-axial fluid collection.  CT MAXILLOFACIAL FINDINGS  Soft tissues: Soft tissue swelling and gas about the left side of the mandible. Normal appearance of the orbits and globes, without retrobulbar hemorrhage.  Bones: Comminuted fracture of the body of the left side of the mandible. Minimally displaced. Adjacent tooth fractures.  Minimally displaced, minimally comminuted fracture of the right mandibular condyle. Example image 38 of series 3. The condyle may be minimally subluxed anteriorly.  Fractures of multiple right-sided mandibular teeth. Teeth 29 through 31. At least 1 right maxillary to fracture, including on image 32 of series 3. Tooth 5.  Zygomatic arches are intact. No fluid in the paranasal sinuses or mastoid air cells. Pterygoid plates intact. Mucosal thickening of the right maxillary sinus.  Coronal reformats demonstrate intact orbital floors.  CT CERVICAL SPINE FINDINGS  Spinal visualization through the bottom of C5 on  the initial exam. From the mid C6 level inferiorly are not well evaluated secondary to motion. Prevertebral soft tissues are within normal limits. No apical pneumothorax.  Skull base intact. Maintenance of vertebral body height through the C5 level. Facets are well-aligned. Coronal reformats demonstrate a normal C1-C2 articulation.  Subsequent repeat imaging includes from C6 through the bottom of T1. No prevertebral soft tissue swelling across these levels. No fracture or subluxation identified.  IMPRESSION: 1. Left frontal scalp soft tissue swelling, without acute intracranial abnormality. 2. Left mandibular body comminuted fracture. Right mandibular condyle fracture with  suggestion of subluxation. Bilateral fractured teeth. 3. No acute findings within the cervical spine.   Electronically Signed   By: Jeronimo Greaves M.D.   On: 02/07/2015 17:07   Ct Chest W Contrast  02/07/2015   CLINICAL DATA:  Level 1 trauma, patient fell 30 feet through the roof of a warehouse and landed face first onto concrete. Facial pain, left wrist pain, left knee pain. History of aortic valve stenosis and mitral valve stenosis and prior cardiac surgery.  EXAM: CT CHEST, ABDOMEN AND PELVIS WITHOUT CONTRAST  TECHNIQUE: Multidetector CT imaging of the chest, abdomen and pelvis was performed following the standard protocol without IV contrast.  COMPARISON:  Radiographs from 02/07/2015  FINDINGS: The patient was scanned with his arms by his sides, introducing streak artifact which can reduce sensitivity and specificity in assessing the upper abdominal organs.  CT CHEST FINDINGS  Mediastinum/Nodes: Dilated ascending aorta at 4 cm. Postoperative findings along the aortic valve. Anterior mediastinal density is probably either postoperative or due to thymus. I am less suspicious of this being acute hemorrhage based on the morphology and appearance.  No pathologic thoracic adenopathy. I do not observe an aortic dissection  Lungs/Pleura: No pneumothorax or pneumomediastinum. Subsegmental atelectasis posteriorly in the left lower lobe.  Musculoskeletal: No thoracic fracture identified. Multilevel Schmorl's nodes without kyphosis.  CT ABDOMEN PELVIS FINDINGS  Hepatobiliary: Unremarkable  Pancreas: Unremarkable  Spleen: Unremarkable  Adrenals/Urinary Tract: Unremarkable  Stomach/Bowel: Unremarkable  Vascular/Lymphatic: No vascular dissection or active bleeding is identified.  Reproductive: Tissue planes around the left side of the prostate gland are somewhat obscured due to hematoma.  Other: No ascites identified.  Musculoskeletal: Acute fracture, left sacrum, primarily involving the anterior portion of the left sacral ala  and extending into the anterior portion of the left sacroiliac joint with some mild widening of the anterior portion of the left SI joint. The adjacent hematoma in the left presacral space with stranding tracking around the superior gluteal artery but without direct extravasation observed, and with hematoma tracking primarily at along the left piriformis muscle but also mildly expanding the left obturator internus. There stranding around the left sciatic nerve as it exits through the sciatic foramen.  As shown on conventional radiography, there are also left pubic rami fractures. These consist of a medial superior ramus fracture extending into the pubic body and upper margin of the pubic symphysis, as well as a mildly comminuted inferior pubic ramus fracture in the midportion of the inferior ramus. Indistinctness of adjacent tissue planes along the medial margins of the left hip adductor musculature noted. Again, no active bleeding is identified. No extension into the left acetabulum is observed. There is a small amount of hemorrhage in the left anterior states of right CS. The regional hematoma does not appear to be causing a great deal of impingement at the operator foramen.  IMPRESSION: 1. Acute pelvic fractures include the anterior upper left sacrum (extending  into the left SI joint) ; the midportion of the inferior pubic ramus ; and the medial portion of the superior pubic ramus extending into the left pubic body. There is adjacent hematoma tracking in the vicinity of these fractures and a small amount of hematoma in the left anterior space of right CS, but we do not demonstrate active extravasation of contrast to suggest active bleeding. Although there is hematoma tracking around the left operator internus and left piriformis muscles, the degree of impingement at the operator foramen and sciatic notch appears currently minimal. 2. 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 I  suspect this is a chronic finding in this patient. There is some adjacent anterior mediastinal density which is probably postoperative in considered less likely to be due to active mediastinal hematoma given the small and bandlike orientation. I do not observe a dissection.   Electronically Signed   By: Gaylyn Rong M.D.   On: 02/07/2015 16:59   Ct Cervical Spine Wo Contrast  02/07/2015   CLINICAL DATA:  Larey Seat from 30 feet. Landed face first. Jaw pain. Initial encounter.  EXAM: CT HEAD WITHOUT CONTRAST  CT MAXILLOFACIAL WITHOUT CONTRAST  CT CERVICAL SPINE WITHOUT CONTRAST  TECHNIQUE: Multidetector CT imaging of the head, cervical spine, and maxillofacial structures were performed using the standard protocol without intravenous contrast. Multiplanar CT image reconstructions of the cervical spine and maxillofacial structures were also generated.  COMPARISON:  Chest and pelvic films of same date.  FINDINGS: CT HEAD FINDINGS  Sinuses/Soft tissues: Soft tissue swelling and probable laceration about the left frontal scalp. Clear paranasal sinuses and mastoid air cells. No skull fracture. Clear mastoid air cells.  Intracranial: No mass lesion, hemorrhage, hydrocephalus, acute infarct, intra-axial, or extra-axial fluid collection.  CT MAXILLOFACIAL FINDINGS  Soft tissues: Soft tissue swelling and gas about the left side of the mandible. Normal appearance of the orbits and globes, without retrobulbar hemorrhage.  Bones: Comminuted fracture of the body of the left side of the mandible. Minimally displaced. Adjacent tooth fractures.  Minimally displaced, minimally comminuted fracture of the right mandibular condyle. Example image 38 of series 3. The condyle may be minimally subluxed anteriorly.  Fractures of multiple right-sided mandibular teeth. Teeth 29 through 31. At least 1 right maxillary to fracture, including on image 32 of series 3. Tooth 5.  Zygomatic arches are intact. No fluid in the paranasal sinuses or  mastoid air cells. Pterygoid plates intact. Mucosal thickening of the right maxillary sinus.  Coronal reformats demonstrate intact orbital floors.  CT CERVICAL SPINE FINDINGS  Spinal visualization through the bottom of C5 on the initial exam. From the mid C6 level inferiorly are not well evaluated secondary to motion. Prevertebral soft tissues are within normal limits. No apical pneumothorax.  Skull base intact. Maintenance of vertebral body height through the C5 level. Facets are well-aligned. Coronal reformats demonstrate a normal C1-C2 articulation.  Subsequent repeat imaging includes from C6 through the bottom of T1. No prevertebral soft tissue swelling across these levels. No fracture or subluxation identified.  IMPRESSION: 1. Left frontal scalp soft tissue swelling, without acute intracranial abnormality. 2. Left mandibular body comminuted fracture. Right mandibular condyle fracture with suggestion of subluxation. Bilateral fractured teeth. 3. No acute findings within the cervical spine.   Electronically Signed   By: Jeronimo Greaves M.D.   On: 02/07/2015 17:07   Ct Knee Left Wo Contrast  02/07/2015   CLINICAL DATA:  Level 1 trauma. Pull-through where house roof. Evaluate distal  femur fracture. Initial encounter.  EXAM: CT OF THE LEFT KNEE WITHOUT CONTRAST  TECHNIQUE: Multidetector CT imaging of the left knee was performed according to the standard protocol. Multiplanar CT image reconstructions were also generated.  COMPARISON:  None.  FINDINGS: There is a mildly comminuted and significantly displaced fracture of the distal femoral diaphysis. This fracture demonstrates more than 1 shaft width of posterior displacement and mild overriding of the fracture fragments. There is a nondisplaced oblique fracture of the distal femur demonstrating intercondylar extension. This fracture exits from the medial metaphyseal cortex. The fracture appears open anteriorly.  Fragmentation of the patella laterally has sharp margins  and is suspicious for a nondisplaced fracture rather than a bipartite patella.  The proximal fibula and proximal tibia are intact.  Large left knee lipohemarthrosis is noted. Intra-articular air is also present. There is multifocal soft tissue emphysema within the central left thigh musculature, proximal extent incompletely visualized. The distal femoral and popliteal arteries appear grossly intact on non contrast imaging. The quadriceps tendon is intact, although is posteriorly buckled at the level of the distal femoral diaphyseal fracture. There is some laxity of the patellar tendon. The cruciate and collateral ligaments appear intact, and there is no dislocation at the knee.  IMPRESSION: 1. Comminuted open fracture of the distal femur with significant posterior displacement as described. There is intra-articular extension of the fracture into the intercondylar region. 2. Suspected nondisplaced fracture of the patella laterally. 3. The open femoral fracture is associated with intra-articular air as well as soft tissue emphysema in the distal thigh. Patient is at risk for intra-articular infection. 4. Possible injury of the quadriceps tendon near the musculotendinous junction without demonstrated complete rupture.   Electronically Signed   By: Carey Bullocks M.D.   On: 02/07/2015 16:59   Ct Abdomen Pelvis W Contrast  02/07/2015   CLINICAL DATA:  Level 1 trauma, patient fell 30 feet through the roof of a warehouse and landed face first onto concrete. Facial pain, left wrist pain, left knee pain. History of aortic valve stenosis and mitral valve stenosis and prior cardiac surgery.  EXAM: CT CHEST, ABDOMEN AND PELVIS WITHOUT CONTRAST  TECHNIQUE: Multidetector CT imaging of the chest, abdomen and pelvis was performed following the standard protocol without IV contrast.  COMPARISON:  Radiographs from 02/07/2015  FINDINGS: The patient was scanned with his arms by his sides, introducing streak artifact which can reduce  sensitivity and specificity in assessing the upper abdominal organs.  CT CHEST FINDINGS  Mediastinum/Nodes: Dilated ascending aorta at 4 cm. Postoperative findings along the aortic valve. Anterior mediastinal density is probably either postoperative or due to thymus. I am less suspicious of this being acute hemorrhage based on the morphology and appearance.  No pathologic thoracic adenopathy. I do not observe an aortic dissection  Lungs/Pleura: No pneumothorax or pneumomediastinum. Subsegmental atelectasis posteriorly in the left lower lobe.  Musculoskeletal: No thoracic fracture identified. Multilevel Schmorl's nodes without kyphosis.  CT ABDOMEN PELVIS FINDINGS  Hepatobiliary: Unremarkable  Pancreas: Unremarkable  Spleen: Unremarkable  Adrenals/Urinary Tract: Unremarkable  Stomach/Bowel: Unremarkable  Vascular/Lymphatic: No vascular dissection or active bleeding is identified.  Reproductive: Tissue planes around the left side of the prostate gland are somewhat obscured due to hematoma.  Other: No ascites identified.  Musculoskeletal: Acute fracture, left sacrum, primarily involving the anterior portion of the left sacral ala and extending into the anterior portion of the left sacroiliac joint with some mild widening of the anterior portion of the left SI joint. The adjacent  hematoma in the left presacral space with stranding tracking around the superior gluteal artery but without direct extravasation observed, and with hematoma tracking primarily at along the left piriformis muscle but also mildly expanding the left obturator internus. There stranding around the left sciatic nerve as it exits through the sciatic foramen.  As shown on conventional radiography, there are also left pubic rami fractures. These consist of a medial superior ramus fracture extending into the pubic body and upper margin of the pubic symphysis, as well as a mildly comminuted inferior pubic ramus fracture in the midportion of the inferior  ramus. Indistinctness of adjacent tissue planes along the medial margins of the left hip adductor musculature noted. Again, no active bleeding is identified. No extension into the left acetabulum is observed. There is a small amount of hemorrhage in the left anterior states of right CS. The regional hematoma does not appear to be causing a great deal of impingement at the operator foramen.  IMPRESSION: 1. Acute pelvic fractures include the anterior upper left sacrum (extending into the left SI joint) ; the midportion of the inferior pubic ramus ; and the medial portion of the superior pubic ramus extending into the left pubic body. There is adjacent hematoma tracking in the vicinity of these fractures and a small amount of hematoma in the left anterior space of right CS, but we do not demonstrate active extravasation of contrast to suggest active bleeding. Although there is hematoma tracking around the left operator internus and left piriformis muscles, the degree of impingement at the operator foramen and sciatic notch appears currently minimal. 2. 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 I suspect this is a chronic finding in this patient. There is some adjacent anterior mediastinal density which is probably postoperative in considered less likely to be due to active mediastinal hematoma given the small and bandlike orientation. I do not observe a dissection.   Electronically Signed   By: Gaylyn Rong M.D.   On: 02/07/2015 16:59   Dg Pelvis Portable  02/07/2015   CLINICAL DATA:  Fall for roof, about 30 feet. Femur and forearm fractures.  EXAM: PORTABLE PELVIS 1-2 VIEWS  COMPARISON:  None.  FINDINGS: Left lateral iliac crest and anterior iliac spine excluded due to difficulty with patient positioning.  Acute fractures of the left pubic rami.  IMPRESSION: 1. Acute fractures of the left pubic rami. There is a high degree of likelihood of an otherwise occult sacral  fracture based on this pattern of injury; this can be assessed on the patient's planned pelvis CT.   Electronically Signed   By: Gaylyn Rong M.D.   On: 02/07/2015 16:17   Dg Chest Portable 1 View  02/07/2015   CLINICAL DATA:  Fall 30 feet, open left femoral fracture  EXAM: PORTABLE CHEST - 1 VIEW  COMPARISON:  None.  FINDINGS: Evidence of median sternotomy. Mild prominence of the cardiomediastinal silhouette is noted which may be in part due to AP portable technique. Visualized lung fields are clear. No displaced rib fracture identified.  IMPRESSION: Borderline enlargement of the cardiomediastinal silhouette without focal acute finding.   Electronically Signed   By: Christiana Pellant M.D.   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 Open femur fracture.  EXAM: LEFT FEMUR 2 VIEWS  COMPARISON:  None.  FINDINGS: Left pubic ramus fractures noted.  Left sacral fracture noted.  Left distal radial metaphyseal oblique fracture observed with 4.3 cm posterior displacement of the distal fracture fragment with respect to the proximal, and the shaft of the proximal fragment clearing the skin surface. Gas tracks in these musculature posterior to the midshaft and down towards the fracture. There is mild comminution at the fracture site. Based on the frontal projection, vertical fracture extension in the distal fragment is noted extending into the intercondylar notch. There is some overlap at the fracture site.  There is also known fracture the lateral patella.  IMPRESSION: 1. Open fracture of the distal femur with the proximal shaft fragment extending to the skin surface, with mild comminution, and with a sagittally oriented extension from the main fracture site into the intercondylar notch. There is gas in the regional soft tissues and joint reflecting the open fracture. 2. The lateral patellar fracture is much better seen on today's CT scan. 3.  Left pubic ramus and left sacral fractures.   Electronically Signed   By: Gaylyn Rong M.D.   On: 02/07/2015 17:43   Ct Maxillofacial Wo Cm  02/07/2015   CLINICAL DATA:  Larey Seat from 30 feet. Landed face first. Jaw pain. Initial encounter.  EXAM: CT HEAD WITHOUT CONTRAST  CT MAXILLOFACIAL WITHOUT CONTRAST  CT CERVICAL SPINE WITHOUT CONTRAST  TECHNIQUE: Multidetector CT imaging of the head, cervical spine, and maxillofacial structures were performed using the standard protocol without intravenous contrast. Multiplanar CT image reconstructions of the cervical spine and maxillofacial structures were also generated.  COMPARISON:  Chest and pelvic films of same date.  FINDINGS: CT HEAD FINDINGS  Sinuses/Soft tissues: Soft tissue swelling and probable laceration about the left frontal scalp. Clear paranasal sinuses and mastoid air cells. No skull fracture. Clear mastoid air cells.  Intracranial: No mass lesion, hemorrhage, hydrocephalus, acute infarct, intra-axial, or extra-axial fluid collection.  CT MAXILLOFACIAL FINDINGS  Soft tissues: Soft tissue swelling and gas about the left side of the mandible. Normal appearance of the orbits and globes, without retrobulbar hemorrhage.  Bones: Comminuted fracture of the body of the left side of the mandible. Minimally displaced. Adjacent tooth fractures.  Minimally displaced, minimally comminuted fracture of the right mandibular condyle. Example image 38 of series 3. The condyle may be minimally subluxed anteriorly.  Fractures of multiple right-sided mandibular teeth. Teeth 29 through 31. At least 1 right maxillary to fracture, including on image 32 of series 3. Tooth 5.  Zygomatic arches are intact. No fluid in the paranasal sinuses or mastoid air cells. Pterygoid plates intact. Mucosal thickening of the right maxillary sinus.  Coronal reformats demonstrate intact orbital floors.  CT CERVICAL SPINE FINDINGS  Spinal visualization through the bottom of C5 on the initial exam.  From the mid C6 level inferiorly are not well evaluated secondary to motion. Prevertebral soft tissues are within normal limits. No apical pneumothorax.  Skull base intact. Maintenance of vertebral body height through the C5 level. Facets are well-aligned. Coronal reformats demonstrate a normal C1-C2 articulation.  Subsequent repeat imaging includes from C6 through the bottom of T1. No prevertebral soft tissue swelling across these levels. No fracture or subluxation identified.  IMPRESSION: 1. Left frontal scalp soft tissue swelling, without acute intracranial abnormality. 2. Left mandibular body comminuted fracture. Right mandibular condyle fracture with suggestion of subluxation. Bilateral fractured teeth. 3. No acute findings within the cervical spine.   Electronically Signed   By: Jeronimo Greaves M.D.   On: 02/07/2015 17:07     EKG Interpretation None  MDM   Final diagnoses:  Open femur fracture, left, type I or II, initial encounter  Left wrist fracture, closed, initial encounter  Open body of mandible fracture, initial encounter  Pelvic fracture, closed, initial encounter   19 year old male presents as level II trauma fall from 30 feet. He dynamically stable in the field. Positive loss of consciousness. Blood pressure 120 systolic on arrival. Obvious injuries including open left femur fracture just proximal to the knee as well as deformity of left wrist, obvious dental trauma with laceration of the left mandible as well as left for head. He also has abrasions to his left chest. Tetanus is up-to-date. Ancef given as well as pain medications. Continue fluid hydration. Orthopedics and trauma consulted. Bedside plain films unremarkable chest x-ray but pelvis x-ray with left superior and inferior pubic rami ractures. Pelvis stable to AP and lateral compression. Stable for transfer to the scanner.  Injuries included open mandible fracture, open distal left femur extending into the knee, obvious  left distal wrist deformity, pelvic fractures. Orthopedics, ENT, trauma consult. Orthopedics to take to the OR.  Dorna Leitz, MD 02/07/15 1753  Blane Ohara, MD 02/08/15 [phone]

## 2015-02-07 NOTE — Consult Note (Signed)
[name] [name], [name] [name] 19 y.o., male [id number]     Chief Complaint: facial lacerations, LEFT mandible body, RIGHT mandible condyle fx's  HPI: 19 yo wm, visiting from [place] fell off a roof onto the ground earlier today.  +LOC.  No EtOH.  No smoking.  C/o lower facial pain and malocclusion.  No vision or hearing issues.  Exam shows lac LEFT forehead, lac LEFT chin.  CT shows saggital fx RIGHT condylar head, and comminuted saggital fx LEFT body of mandible.  Numerous fractured teeth both sides.    PMH: Past Medical History  Diagnosis Date  . Aortic valve stenosis   . Mitral valve stenosis     Surg Hx: Past Surgical History  Procedure Laterality Date  . Exploration post operative open heart      x 2  . Aortic valve replacement      not mechanical    FHx:  History reviewed. No pertinent family history. SocHx:  reports that he has never smoked. He uses smokeless tobacco. His alcohol and drug histories are not on file.  ALLERGIES: No Known Allergies  No prescriptions prior to admission    Results for orders placed or performed during the hospital encounter of 02/07/15 (from the past 48 hour(s))  Type and screen     Status: None   Collection Time: 02/07/15  3:39 PM  Result Value Ref Range   ABO/RH(D) O POS    Antibody Screen NEG    Sample Expiration 02/10/2015   I-Stat Chem 8, ED     Status: Abnormal   Collection Time: 02/07/15  3:42 PM  Result Value Ref Range   Sodium 142 135 - 145 mmol/L   Potassium 2.9 (L) 3.5 - 5.1 mmol/L   Chloride 105 96 - 112 mmol/L   BUN 12 6 - 23 mg/dL   Creatinine, Ser 1.00 0.50 - 1.35 mg/dL   Glucose, Bld 135 (H) 70 - 99 mg/dL   Calcium, Ion 1.19 1.12 - 1.23 mmol/L   TCO2 18 0 - 100 mmol/L   Hemoglobin 17.7 (H) 13.0 - 17.0 g/dL   HCT 52.0 39.0 - 52.0 %  I-Stat CG4 Lactic Acid, ED     Status: Abnormal   Collection Time: 02/07/15  3:42 PM  Result Value Ref Range   Lactic Acid, Venous 3.53 (HH) 0.5 - 2.0 mmol/L   Comment NOTIFIED PHYSICIAN   Ethanol      Status: None   Collection Time: 02/07/15  3:43 PM  Result Value Ref Range   Alcohol, Ethyl (B) <5 0 - 9 mg/dL    Comment:        LOWEST DETECTABLE LIMIT FOR SERUM ALCOHOL IS 11 mg/dL FOR MEDICAL PURPOSES ONLY   Comprehensive metabolic panel     Status: Abnormal   Collection Time: 02/07/15  5:00 PM  Result Value Ref Range   Sodium 139 135 - 145 mmol/L   Potassium 3.1 (L) 3.5 - 5.1 mmol/L   Chloride 105 96 - 112 mmol/L   CO2 21 19 - 32 mmol/L   Glucose, Bld 124 (H) 70 - 99 mg/dL   BUN 11 6 - 23 mg/dL   Creatinine, Ser 1.11 0.50 - 1.35 mg/dL   Calcium 10.1 8.4 - 10.5 mg/dL   Total Protein 7.9 6.0 - 8.3 g/dL   Albumin 4.9 3.5 - 5.2 g/dL   AST 63 (H) 0 - 37 U/L   ALT 45 0 - 53 U/L   Alkaline Phosphatase 90 39 - 117 U/L  Total Bilirubin 0.7 0.3 - 1.2 mg/dL   GFR calc non Af Amer >90 >90 mL/min   GFR calc Af Amer >90 >90 mL/min    Comment: (NOTE) The eGFR has been calculated using the CKD EPI equation. This calculation has not been validated in all clinical situations. eGFR's persistently <90 mL/min signify possible Chronic Kidney Disease.    Anion gap 13 5 - 15  CBC     Status: Abnormal   Collection Time: 02/07/15  5:00 PM  Result Value Ref Range   WBC 20.9 (H) 4.0 - 10.5 K/uL   RBC 5.43 4.22 - 5.81 MIL/uL   Hemoglobin 16.5 13.0 - 17.0 g/dL   HCT 47.4 39.0 - 52.0 %   MCV 87.3 78.0 - 100.0 fL   MCH 30.4 26.0 - 34.0 pg   MCHC 34.8 30.0 - 36.0 g/dL   RDW 13.0 11.5 - 15.5 %   Platelets 328 150 - 400 K/uL  Protime-INR     Status: None   Collection Time: 02/07/15  5:00 PM  Result Value Ref Range   Prothrombin Time 14.9 11.6 - 15.2 seconds   INR 1.16 0.00 - 1.49   Dg Forearm Left  02/07/2015   CLINICAL DATA:  Fall through roof from 30 foot height. Left forearm pain and physical deformity. Initial encounter.  EXAM: LEFT FOREARM - 2 VIEW  COMPARISON:  None.  FINDINGS: A comminuted fracture of the distal radius is seen, with involvement of the radiocarpal and distal radial  ulnar joints. There is posterior displacement and angulation of the distal radial fracture fragments.  In addition, there is a comminuted fracture of the radial head. No ulnar fracture identified.  IMPRESSION: Comminuted distal radius fracture with dorsal displacement and angulation.  Comminuted fracture of the radial head. Dedicated elbow radiographs recommended for further evaluation.   Electronically Signed   By: Earle Gell M.D.   On: 02/07/2015 17:35   Dg Wrist Complete Left  02/07/2015   CLINICAL DATA:  [name] while working on roof.  EXAM: LEFT WRIST - COMPLETE 3+ VIEW  COMPARISON:  None.  FINDINGS: There is a comminuted intra-articular fracture of the distal radius with marked dorsal displacement, impaction and dorsal angulation. There also is a fracture across the midportion of the triquetrum, minimally displaced. There is a transverse nondisplaced fracture across the capitate. At the radial aspect of the wrist, the scaphoid, trapezoid and trapezium are not well seen on this study due to positioning limitations and rotation.  IMPRESSION: 1. Comminuted intra-articular fracture of the distal radius with marked impaction, displacement and angulation. 2. Transverse fractures across the triquetrum and capitate 3. Limited visibility of the bones at the radial aspect of the wrist.   Electronically Signed   By: Andreas Newport M.D.   On: 02/07/2015 17:38   Ct Head Wo Contrast  02/07/2015   CLINICAL DATA:  [name] from 30 feet. Landed face first. Jaw pain. Initial encounter.  EXAM: CT HEAD WITHOUT CONTRAST  CT MAXILLOFACIAL WITHOUT CONTRAST  CT CERVICAL SPINE WITHOUT CONTRAST  TECHNIQUE: Multidetector CT imaging of the head, cervical spine, and maxillofacial structures were performed using the standard protocol without intravenous contrast. Multiplanar CT image reconstructions of the cervical spine and maxillofacial structures were also generated.  COMPARISON:  Chest and pelvic films of same date.  FINDINGS: CT HEAD  FINDINGS  Sinuses/Soft tissues: Soft tissue swelling and probable laceration about the left frontal scalp. Clear paranasal sinuses and mastoid air cells. No skull fracture. Clear mastoid air cells.  Intracranial: No mass lesion, hemorrhage, hydrocephalus, acute infarct, intra-axial, or extra-axial fluid collection.  CT MAXILLOFACIAL FINDINGS  Soft tissues: Soft tissue swelling and gas about the left side of the mandible. Normal appearance of the orbits and globes, without retrobulbar hemorrhage.  Bones: Comminuted fracture of the body of the left side of the mandible. Minimally displaced. Adjacent tooth fractures.  Minimally displaced, minimally comminuted fracture of the right mandibular condyle. Example image 38 of series 3. The condyle may be minimally subluxed anteriorly.  Fractures of multiple right-sided mandibular teeth. Teeth 29 through 31. At least 1 right maxillary to fracture, including on image 32 of series 3. Tooth 5.  Zygomatic arches are intact. No fluid in the paranasal sinuses or mastoid air cells. Pterygoid plates intact. Mucosal thickening of the right maxillary sinus.  Coronal reformats demonstrate intact orbital floors.  CT CERVICAL SPINE FINDINGS  Spinal visualization through the bottom of C5 on the initial exam. From the mid C6 level inferiorly are not well evaluated secondary to motion. Prevertebral soft tissues are within normal limits. No apical pneumothorax.  Skull base intact. Maintenance of vertebral body height through the C5 level. Facets are well-aligned. Coronal reformats demonstrate a normal C1-C2 articulation.  Subsequent repeat imaging includes from C6 through the bottom of T1. No prevertebral soft tissue swelling across these levels. No fracture or subluxation identified.  IMPRESSION: 1. Left frontal scalp soft tissue swelling, without acute intracranial abnormality. 2. Left mandibular body comminuted fracture. Right mandibular condyle fracture with suggestion of subluxation.  Bilateral fractured teeth. 3. No acute findings within the cervical spine.   Electronically Signed   By: Abigail Miyamoto M.D.   On: 02/07/2015 17:07   Ct Chest W Contrast  02/07/2015   CLINICAL DATA:  Level 1 trauma, patient fell 30 feet through the roof of a warehouse and landed face first onto concrete. Facial pain, left wrist pain, left knee pain. History of aortic valve stenosis and mitral valve stenosis and prior cardiac surgery.  EXAM: CT CHEST, ABDOMEN AND PELVIS WITHOUT CONTRAST  TECHNIQUE: Multidetector CT imaging of the chest, abdomen and pelvis was performed following the standard protocol without IV contrast.  COMPARISON:  Radiographs from 02/07/2015  FINDINGS: The patient was scanned with his arms by his sides, introducing streak artifact which can reduce sensitivity and specificity in assessing the upper abdominal organs.  CT CHEST FINDINGS  Mediastinum/Nodes: Dilated ascending aorta at 4 cm. Postoperative findings along the aortic valve. Anterior mediastinal density is probably either postoperative or due to thymus. I am less suspicious of this being acute hemorrhage based on the morphology and appearance.  No pathologic thoracic adenopathy. I do not observe an aortic dissection  Lungs/Pleura: No pneumothorax or pneumomediastinum. Subsegmental atelectasis posteriorly in the left lower lobe.  Musculoskeletal: No thoracic fracture identified. Multilevel Schmorl's nodes without kyphosis.  CT ABDOMEN PELVIS FINDINGS  Hepatobiliary: Unremarkable  Pancreas: Unremarkable  Spleen: Unremarkable  Adrenals/Urinary Tract: Unremarkable  Stomach/Bowel: Unremarkable  Vascular/Lymphatic: No vascular dissection or active bleeding is identified.  Reproductive: Tissue planes around the left side of the prostate gland are somewhat obscured due to hematoma.  Other: No ascites identified.  Musculoskeletal: Acute fracture, left sacrum, primarily involving the anterior portion of the left sacral ala and extending into the  anterior portion of the left sacroiliac joint with some mild widening of the anterior portion of the left SI joint. The adjacent hematoma in the left presacral space with stranding tracking around the superior gluteal artery but without direct extravasation observed, and  with hematoma tracking primarily at along the left piriformis muscle but also mildly expanding the left obturator internus. There stranding around the left sciatic nerve as it exits through the sciatic foramen.  As shown on conventional radiography, there are also left pubic rami fractures. These consist of a medial superior ramus fracture extending into the pubic body and upper margin of the pubic symphysis, as well as a mildly comminuted inferior pubic ramus fracture in the midportion of the inferior ramus. Indistinctness of adjacent tissue planes along the medial margins of the left hip adductor musculature noted. Again, no active bleeding is identified. No extension into the left acetabulum is observed. There is a small amount of hemorrhage in the left anterior states of right CS. The regional hematoma does not appear to be causing a great deal of impingement at the operator foramen.  IMPRESSION: 1. Acute pelvic fractures include the anterior upper left sacrum (extending into the left SI joint) ; the midportion of the inferior pubic ramus ; and the medial portion of the superior pubic ramus extending into the left pubic body. There is adjacent hematoma tracking in the vicinity of these fractures and a small amount of hematoma in the left anterior space of right CS, but we do not demonstrate active extravasation of contrast to suggest active bleeding. Although there is hematoma tracking around the left operator internus and left piriformis muscles, the degree of impingement at the operator foramen and sciatic notch appears currently minimal. 2. 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 I suspect this is a  chronic finding in this patient. There is some adjacent anterior mediastinal density which is probably postoperative in considered less likely to be due to active mediastinal hematoma given the small and bandlike orientation. I do not observe a dissection.   Electronically Signed   By: Van Clines M.D.   On: 02/07/2015 16:59   Ct Cervical Spine Wo Contrast  02/07/2015   CLINICAL DATA:  [name] from 30 feet. Landed face first. Jaw pain. Initial encounter.  EXAM: CT HEAD WITHOUT CONTRAST  CT MAXILLOFACIAL WITHOUT CONTRAST  CT CERVICAL SPINE WITHOUT CONTRAST  TECHNIQUE: Multidetector CT imaging of the head, cervical spine, and maxillofacial structures were performed using the standard protocol without intravenous contrast. Multiplanar CT image reconstructions of the cervical spine and maxillofacial structures were also generated.  COMPARISON:  Chest and pelvic films of same date.  FINDINGS: CT HEAD FINDINGS  Sinuses/Soft tissues: Soft tissue swelling and probable laceration about the left frontal scalp. Clear paranasal sinuses and mastoid air cells. No skull fracture. Clear mastoid air cells.  Intracranial: No mass lesion, hemorrhage, hydrocephalus, acute infarct, intra-axial, or extra-axial fluid collection.  CT MAXILLOFACIAL FINDINGS  Soft tissues: Soft tissue swelling and gas about the left side of the mandible. Normal appearance of the orbits and globes, without retrobulbar hemorrhage.  Bones: Comminuted fracture of the body of the left side of the mandible. Minimally displaced. Adjacent tooth fractures.  Minimally displaced, minimally comminuted fracture of the right mandibular condyle. Example image 38 of series 3. The condyle may be minimally subluxed anteriorly.  Fractures of multiple right-sided mandibular teeth. Teeth 29 through 31. At least 1 right maxillary to fracture, including on image 32 of series 3. Tooth 5.  Zygomatic arches are intact. No fluid in the paranasal sinuses or mastoid air cells.  Pterygoid plates intact. Mucosal thickening of the right maxillary sinus.  Coronal reformats demonstrate intact orbital floors.  CT CERVICAL SPINE FINDINGS  Spinal  visualization through the bottom of C5 on the initial exam. From the mid C6 level inferiorly are not well evaluated secondary to motion. Prevertebral soft tissues are within normal limits. No apical pneumothorax.  Skull base intact. Maintenance of vertebral body height through the C5 level. Facets are well-aligned. Coronal reformats demonstrate a normal C1-C2 articulation.  Subsequent repeat imaging includes from C6 through the bottom of T1. No prevertebral soft tissue swelling across these levels. No fracture or subluxation identified.  IMPRESSION: 1. Left frontal scalp soft tissue swelling, without acute intracranial abnormality. 2. Left mandibular body comminuted fracture. Right mandibular condyle fracture with suggestion of subluxation. Bilateral fractured teeth. 3. No acute findings within the cervical spine.   Electronically Signed   By: Abigail Miyamoto M.D.   On: 02/07/2015 17:07   Ct Knee Left Wo Contrast  02/07/2015   CLINICAL DATA:  Level 1 trauma. Pull-through where house roof. Evaluate distal femur fracture. Initial encounter.  EXAM: CT OF THE LEFT KNEE WITHOUT CONTRAST  TECHNIQUE: Multidetector CT imaging of the left knee was performed according to the standard protocol. Multiplanar CT image reconstructions were also generated.  COMPARISON:  None.  FINDINGS: There is a mildly comminuted and significantly displaced fracture of the distal femoral diaphysis. This fracture demonstrates more than 1 shaft width of posterior displacement and mild overriding of the fracture fragments. There is a nondisplaced oblique fracture of the distal femur demonstrating intercondylar extension. This fracture exits from the medial metaphyseal cortex. The fracture appears open anteriorly.  Fragmentation of the patella laterally has sharp margins and is suspicious  for a nondisplaced fracture rather than a bipartite patella.  The proximal fibula and proximal tibia are intact.  Large left knee lipohemarthrosis is noted. Intra-articular air is also present. There is multifocal soft tissue emphysema within the central left thigh musculature, proximal extent incompletely visualized. The distal femoral and popliteal arteries appear grossly intact on non contrast imaging. The quadriceps tendon is intact, although is posteriorly buckled at the level of the distal femoral diaphyseal fracture. There is some laxity of the patellar tendon. The cruciate and collateral ligaments appear intact, and there is no dislocation at the knee.  IMPRESSION: 1. Comminuted open fracture of the distal femur with significant posterior displacement as described. There is intra-articular extension of the fracture into the intercondylar region. 2. Suspected nondisplaced fracture of the patella laterally. 3. The open femoral fracture is associated with intra-articular air as well as soft tissue emphysema in the distal thigh. Patient is at risk for intra-articular infection. 4. Possible injury of the quadriceps tendon near the musculotendinous junction without demonstrated complete rupture.   Electronically Signed   By: Richardean Sale M.D.   On: 02/07/2015 16:59   Ct Abdomen Pelvis W Contrast  02/07/2015   CLINICAL DATA:  Level 1 trauma, patient fell 30 feet through the roof of a warehouse and landed face first onto concrete. Facial pain, left wrist pain, left knee pain. History of aortic valve stenosis and mitral valve stenosis and prior cardiac surgery.  EXAM: CT CHEST, ABDOMEN AND PELVIS WITHOUT CONTRAST  TECHNIQUE: Multidetector CT imaging of the chest, abdomen and pelvis was performed following the standard protocol without IV contrast.  COMPARISON:  Radiographs from 02/07/2015  FINDINGS: The patient was scanned with his arms by his sides, introducing streak artifact which can reduce sensitivity and  specificity in assessing the upper abdominal organs.  CT CHEST FINDINGS  Mediastinum/Nodes: Dilated ascending aorta at 4 cm. Postoperative findings along the aortic valve. Anterior  mediastinal density is probably either postoperative or due to thymus. I am less suspicious of this being acute hemorrhage based on the morphology and appearance.  No pathologic thoracic adenopathy. I do not observe an aortic dissection  Lungs/Pleura: No pneumothorax or pneumomediastinum. Subsegmental atelectasis posteriorly in the left lower lobe.  Musculoskeletal: No thoracic fracture identified. Multilevel Schmorl's nodes without kyphosis.  CT ABDOMEN PELVIS FINDINGS  Hepatobiliary: Unremarkable  Pancreas: Unremarkable  Spleen: Unremarkable  Adrenals/Urinary Tract: Unremarkable  Stomach/Bowel: Unremarkable  Vascular/Lymphatic: No vascular dissection or active bleeding is identified.  Reproductive: Tissue planes around the left side of the prostate gland are somewhat obscured due to hematoma.  Other: No ascites identified.  Musculoskeletal: Acute fracture, left sacrum, primarily involving the anterior portion of the left sacral ala and extending into the anterior portion of the left sacroiliac joint with some mild widening of the anterior portion of the left SI joint. The adjacent hematoma in the left presacral space with stranding tracking around the superior gluteal artery but without direct extravasation observed, and with hematoma tracking primarily at along the left piriformis muscle but also mildly expanding the left obturator internus. There stranding around the left sciatic nerve as it exits through the sciatic foramen.  As shown on conventional radiography, there are also left pubic rami fractures. These consist of a medial superior ramus fracture extending into the pubic body and upper margin of the pubic symphysis, as well as a mildly comminuted inferior pubic ramus fracture in the midportion of the inferior ramus.  Indistinctness of adjacent tissue planes along the medial margins of the left hip adductor musculature noted. Again, no active bleeding is identified. No extension into the left acetabulum is observed. There is a small amount of hemorrhage in the left anterior states of right CS. The regional hematoma does not appear to be causing a great deal of impingement at the operator foramen.  IMPRESSION: 1. Acute pelvic fractures include the anterior upper left sacrum (extending into the left SI joint) ; the midportion of the inferior pubic ramus ; and the medial portion of the superior pubic ramus extending into the left pubic body. There is adjacent hematoma tracking in the vicinity of these fractures and a small amount of hematoma in the left anterior space of right CS, but we do not demonstrate active extravasation of contrast to suggest active bleeding. Although there is hematoma tracking around the left operator internus and left piriformis muscles, the degree of impingement at the operator foramen and sciatic notch appears currently minimal. 2. 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 I suspect this is a chronic finding in this patient. There is some adjacent anterior mediastinal density which is probably postoperative in considered less likely to be due to active mediastinal hematoma given the small and bandlike orientation. I do not observe a dissection.   Electronically Signed   By: Van Clines M.D.   On: 02/07/2015 16:59   Dg Pelvis Portable  02/07/2015   CLINICAL DATA:  Fall for roof, about 30 feet. Femur and forearm fractures.  EXAM: PORTABLE PELVIS 1-2 VIEWS  COMPARISON:  None.  FINDINGS: Left lateral iliac crest and anterior iliac spine excluded due to difficulty with patient positioning.  Acute fractures of the left pubic rami.  IMPRESSION: 1. Acute fractures of the left pubic rami. There is a high degree of likelihood of an otherwise occult sacral fracture based  on this pattern of injury; this can be assessed on the patient's planned pelvis  CT.   Electronically Signed   By: Van Clines M.D.   On: 02/07/2015 16:17   Dg Chest Portable 1 View  02/07/2015   CLINICAL DATA:  Fall 30 feet, open left femoral fracture  EXAM: PORTABLE CHEST - 1 VIEW  COMPARISON:  None.  FINDINGS: Evidence of median sternotomy. Mild prominence of the cardiomediastinal silhouette is noted which may be in part due to AP portable technique. Visualized lung fields are clear. No displaced rib fracture identified.  IMPRESSION: Borderline enlargement of the cardiomediastinal silhouette without focal acute finding.   Electronically Signed   By: Conchita Paris M.D.   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 Open femur fracture.  EXAM: LEFT FEMUR 2 VIEWS  COMPARISON:  None.  FINDINGS: Left pubic ramus fractures noted.  Left sacral fracture noted.  Left distal radial metaphyseal oblique fracture observed with 4.3 cm posterior displacement of the distal fracture fragment with respect to the proximal, and the shaft of the proximal fragment clearing the skin surface. Gas tracks in these musculature posterior to the midshaft and down towards the fracture. There is mild comminution at the fracture site. Based on the frontal projection, vertical fracture extension in the distal fragment is noted extending into the intercondylar notch. There is some overlap at the fracture site.  There is also known fracture the lateral patella.  IMPRESSION: 1. Open fracture of the distal femur with the proximal shaft fragment extending to the skin surface, with mild comminution, and with a sagittally oriented extension from the main fracture site into the intercondylar notch. There is gas in the regional soft tissues and joint reflecting the open fracture. 2. The lateral patellar fracture is much better seen on today's CT scan. 3. Left pubic  ramus and left sacral fractures.   Electronically Signed   By: Van Clines M.D.   On: 02/07/2015 17:43   Ct Maxillofacial Wo Cm  02/07/2015   CLINICAL DATA:  [name] from 30 feet. Landed face first. Jaw pain. Initial encounter.  EXAM: CT HEAD WITHOUT CONTRAST  CT MAXILLOFACIAL WITHOUT CONTRAST  CT CERVICAL SPINE WITHOUT CONTRAST  TECHNIQUE: Multidetector CT imaging of the head, cervical spine, and maxillofacial structures were performed using the standard protocol without intravenous contrast. Multiplanar CT image reconstructions of the cervical spine and maxillofacial structures were also generated.  COMPARISON:  Chest and pelvic films of same date.  FINDINGS: CT HEAD FINDINGS  Sinuses/Soft tissues: Soft tissue swelling and probable laceration about the left frontal scalp. Clear paranasal sinuses and mastoid air cells. No skull fracture. Clear mastoid air cells.  Intracranial: No mass lesion, hemorrhage, hydrocephalus, acute infarct, intra-axial, or extra-axial fluid collection.  CT MAXILLOFACIAL FINDINGS  Soft tissues: Soft tissue swelling and gas about the left side of the mandible. Normal appearance of the orbits and globes, without retrobulbar hemorrhage.  Bones: Comminuted fracture of the body of the left side of the mandible. Minimally displaced. Adjacent tooth fractures.  Minimally displaced, minimally comminuted fracture of the right mandibular condyle. Example image 38 of series 3. The condyle may be minimally subluxed anteriorly.  Fractures of multiple right-sided mandibular teeth. Teeth 29 through 31. At least 1 right maxillary to fracture, including on image 32 of series 3. Tooth 5.  Zygomatic arches are intact. No fluid in the paranasal sinuses or mastoid air cells. Pterygoid plates intact. Mucosal thickening of the right maxillary sinus.  Coronal reformats  demonstrate intact orbital floors.  CT CERVICAL SPINE FINDINGS  Spinal visualization through the bottom of C5 on the initial exam. From the  mid C6 level inferiorly are not well evaluated secondary to motion. Prevertebral soft tissues are within normal limits. No apical pneumothorax.  Skull base intact. Maintenance of vertebral body height through the C5 level. Facets are well-aligned. Coronal reformats demonstrate a normal C1-C2 articulation.  Subsequent repeat imaging includes from C6 through the bottom of T1. No prevertebral soft tissue swelling across these levels. No fracture or subluxation identified.  IMPRESSION: 1. Left frontal scalp soft tissue swelling, without acute intracranial abnormality. 2. Left mandibular body comminuted fracture. Right mandibular condyle fracture with suggestion of subluxation. Bilateral fractured teeth. 3. No acute findings within the cervical spine.   Electronically Signed   By: Abigail Miyamoto M.D.   On: 02/07/2015 17:07     Blood pressure 116/64, pulse 61, temperature 98.8 F (37.1 C), temperature source Oral, resp. rate 21, height 6' 1"  (1.854 m), weight 115.667 kg (255 lb), SpO2 100 %.  PHYSICAL EXAM: Overall appearance:  Bloody soiling about face and neck.   Head:  4 cm lac LEFT forehead. Ears:  Clear.  - Battle's sign  Nose:  Nl config.  Healthy mucosa Oral Cavity:  Multiple chipped teeth.   Oral Pharynx/Hypopharynx/Larynx:  Could not examine. Neuro:  Grossly intact.  Mental status seems intact Neck:  2 cm lac LEFT chin.  Not obviously communicating with jaw fx  Studies Reviewed:  CT maxillofacial    Assessment/Plan Comminuted LEFT body of mandible fx.  Non-displaced RIGHT mandibular condyle fx.    Facial lacerations  For closure of facial lac's, MMF for stabilization.  Will accompany Ortho to OR this evening.  Discussed with patient.  Informed consent obtained.   Jodi Marble 07/14/9149, 6:22 PM

## 2015-02-07 NOTE — Anesthesia Preprocedure Evaluation (Addendum)
Anesthesia Evaluation  Patient identified by MRN, date of birth, ID band Patient awake    Reviewed: Allergy & Precautions, NPO status , Patient's Chart, lab work & pertinent test results  Airway Mallampati: III   Neck ROM: limited   Comment: c-collar in place Dental  (+) Dental Advisory Given, Loose Multiple chipped, loose teeth from fall. 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  TTE after the AVR shows residual mild to moderate aortic valve stenosis and mild MR.  LV function is vigorous.   Neuro/Psych    GI/Hepatic   Endo/Other  obese  Renal/GU      Musculoskeletal   Abdominal   Peds  Hematology   Anesthesia Other Findings   Reproductive/Obstetrics                           Anesthesia Physical Anesthesia Plan  ASA: II and emergent  Anesthesia Plan: General   Post-op Pain Management:    Induction: Intravenous  Airway Management Planned: Oral ETT  Additional Equipment:   Intra-op Plan:   Post-operative Plan: Extubation in OR  Informed Consent: I have reviewed the patients History and Physical, chart, labs and discussed the procedure including the risks, benefits and alternatives for the proposed anesthesia with the patient or authorized representative who has indicated his/her understanding and acceptance.   Dental advisory given  Plan Discussed with: CRNA, Anesthesiologist and Surgeon  Anesthesia Plan Comments:        Anesthesia Quick Evaluation

## 2015-02-07 NOTE — ED Notes (Signed)
Pt returned from radiology.

## 2015-02-07 NOTE — ED Notes (Signed)
No IV fluids ordered

## 2015-02-07 NOTE — ED Notes (Signed)
Pt taken to CT scan.

## 2015-02-08 DIAGNOSIS — S82002A Unspecified fracture of left patella, initial encounter for closed fracture: Secondary | ICD-10-CM | POA: Diagnosis present

## 2015-02-08 DIAGNOSIS — D62 Acute posthemorrhagic anemia: Secondary | ICD-10-CM | POA: Diagnosis not present

## 2015-02-08 DIAGNOSIS — S060XAA Concussion with loss of consciousness status unknown, initial encounter: Secondary | ICD-10-CM | POA: Diagnosis present

## 2015-02-08 DIAGNOSIS — S62309A Unspecified fracture of unspecified metacarpal bone, initial encounter for closed fracture: Secondary | ICD-10-CM | POA: Diagnosis present

## 2015-02-08 DIAGNOSIS — S7292XB Unspecified fracture of left femur, initial encounter for open fracture type I or II: Secondary | ICD-10-CM | POA: Diagnosis present

## 2015-02-08 DIAGNOSIS — S0181XA Laceration without foreign body of other part of head, initial encounter: Secondary | ICD-10-CM | POA: Diagnosis present

## 2015-02-08 DIAGNOSIS — S02609A Fracture of mandible, unspecified, initial encounter for closed fracture: Secondary | ICD-10-CM | POA: Diagnosis present

## 2015-02-08 DIAGNOSIS — S3282XA Multiple fractures of pelvis without disruption of pelvic ring, initial encounter for closed fracture: Secondary | ICD-10-CM | POA: Diagnosis present

## 2015-02-08 DIAGNOSIS — IMO0002 Reserved for concepts with insufficient information to code with codable children: Secondary | ICD-10-CM

## 2015-02-08 DIAGNOSIS — S62102A Fracture of unspecified carpal bone, left wrist, initial encounter for closed fracture: Secondary | ICD-10-CM | POA: Diagnosis present

## 2015-02-08 DIAGNOSIS — S060X9A Concussion with loss of consciousness of unspecified duration, initial encounter: Secondary | ICD-10-CM | POA: Diagnosis present

## 2015-02-08 DIAGNOSIS — S025XXA Fracture of tooth (traumatic), initial encounter for closed fracture: Secondary | ICD-10-CM | POA: Diagnosis present

## 2015-02-08 LAB — BASIC METABOLIC PANEL
Anion gap: 11 (ref 5–15)
BUN: 9 mg/dL (ref 6–23)
CALCIUM: 8.8 mg/dL (ref 8.4–10.5)
CHLORIDE: 104 mmol/L (ref 96–112)
CO2: 23 mmol/L (ref 19–32)
Creatinine, Ser: 0.89 mg/dL (ref 0.50–1.35)
GFR calc Af Amer: >90 mL/min (ref >90)
GFR calc non Af Amer: >90 mL/min (ref >90)
GLUCOSE: 135 mg/dL — AB (ref 70–99)
Potassium: 4.4 mmol/L (ref 3.5–5.1)
SODIUM: 138 mmol/L (ref 135–145)

## 2015-02-08 LAB — CBC
HCT: 35.5 % — ABNORMAL LOW (ref 39.0–52.0)
HEMOGLOBIN: 12.1 g/dL — AB (ref 13.0–17.0)
MCH: 30.1 pg (ref 26.0–34.0)
MCHC: 34.1 g/dL (ref 30.0–36.0)
MCV: 88.3 fL (ref 78.0–100.0)
Platelets: 199 10*3/uL (ref 150–400)
RBC: 4.02 MIL/uL — AB (ref 4.22–5.81)
RDW: 13 % (ref 11.5–15.5)
WBC: 17 10*3/uL — ABNORMAL HIGH (ref 4.0–10.5)

## 2015-02-08 LAB — MRSA PCR SCREENING: MRSA by PCR: NEGATIVE

## 2015-02-08 LAB — ABO/RH: ABO/RH(D): O POS

## 2015-02-08 MED ORDER — ONDANSETRON HCL 4 MG/2ML IJ SOLN
4.0000 mg | Freq: Four times a day (QID) | INTRAMUSCULAR | Status: DC | PRN
  Filled 2015-02-08: qty 2

## 2015-02-08 MED ORDER — PANTOPRAZOLE SODIUM 40 MG PO TBEC: 40.0000 mg | DELAYED_RELEASE_TABLET | Freq: Every day | ORAL | Status: DC

## 2015-02-08 MED ORDER — HYDROMORPHONE HCL 1 MG/ML IJ SOLN: 1.0000 mg | INTRAMUSCULAR | Status: DC | PRN

## 2015-02-08 MED ORDER — POTASSIUM CHLORIDE IN NACL 20-0.9 MEQ/L-% IV SOLN
INTRAVENOUS | Status: DC
  Administered 2015-02-08: 02:00:00 via INTRAVENOUS
  Filled 2015-02-08 (×4): qty 1000

## 2015-02-08 MED ORDER — MORPHINE SULFATE 2 MG/ML IJ SOLN
1.0000 mg | INTRAMUSCULAR | Status: DC | PRN
  Administered 2015-02-08 (×2): 4 mg via INTRAVENOUS
  Filled 2015-02-08 (×2): qty 2

## 2015-02-08 MED ORDER — HYDROCODONE-ACETAMINOPHEN 7.5-325 MG/15ML PO SOLN
10.0000 mL | ORAL | Status: DC | PRN
  Administered 2015-02-08 (×2): 20 mL via ORAL
  Administered 2015-02-08: 10 mL via ORAL
  Administered 2015-02-09: 20 mL via ORAL
  Administered 2015-02-09: 15 mL via ORAL
  Administered 2015-02-09 – 2015-02-11 (×8): 20 mL via ORAL
  Administered 2015-02-12: 15 mL via ORAL
  Administered 2015-02-12: 20 mL via ORAL
  Filled 2015-02-08: qty 15
  Filled 2015-02-08 (×9): qty 30
  Filled 2015-02-08 (×2): qty 15
  Filled 2015-02-08 (×3): qty 30

## 2015-02-08 MED ORDER — HYDROMORPHONE HCL 1 MG/ML IJ SOLN
0.5000 mg | INTRAMUSCULAR | Status: DC | PRN
  Administered 2015-02-08: 0.5 mg via INTRAVENOUS
  Filled 2015-02-08: qty 1

## 2015-02-08 MED ORDER — CEFAZOLIN SODIUM-DEXTROSE 2-3 GM-% IV SOLR
2.0000 g | Freq: Four times a day (QID) | INTRAVENOUS | Status: AC
  Administered 2015-02-08 (×3): 2 g via INTRAVENOUS
  Filled 2015-02-08 (×4): qty 50

## 2015-02-08 MED ORDER — ENOXAPARIN SODIUM 30 MG/0.3ML ~~LOC~~ SOLN
30.0000 mg | Freq: Two times a day (BID) | SUBCUTANEOUS | Status: DC
  Administered 2015-02-08 – 2015-02-12 (×9): 30 mg via SUBCUTANEOUS
  Filled 2015-02-08 (×10): qty 0.3

## 2015-02-08 MED ORDER — MORPHINE SULFATE 2 MG/ML IJ SOLN: 1.0000 mg | INTRAMUSCULAR | Status: DC | PRN

## 2015-02-08 MED ORDER — PANTOPRAZOLE SODIUM 40 MG IV SOLR
40.0000 mg | Freq: Every day | INTRAVENOUS | Status: DC
  Filled 2015-02-08: qty 40

## 2015-02-08 MED ORDER — ONDANSETRON HCL 4 MG PO TABS: 4.0000 mg | ORAL_TABLET | Freq: Four times a day (QID) | ORAL | Status: DC | PRN

## 2015-02-08 MED ORDER — OXYCODONE HCL 5 MG/5ML PO SOLN: 5.0000 mg | ORAL | Status: DC | PRN

## 2015-02-08 MED ORDER — ENOXAPARIN SODIUM 40 MG/0.4ML ~~LOC~~ SOLN
40.0000 mg | SUBCUTANEOUS | Status: DC
  Filled 2015-02-08: qty 0.4

## 2015-02-08 NOTE — Progress Notes (Signed)
Patient ID: [name] SicilianFrank [name], male   DOB: Oct 31, 1996, 19 y.o.   MRN: [id number]   LOS: 1 day   Subjective: Doing ok.   Objective: Vital signs in last 24 hours: Temp:  [97.5 F (36.4 C)-98.8 F (37.1 C)] 97.5 F (36.4 C) (04/03 0400) Pulse Rate:  [61-104] 104 (04/03 0900) Resp:  [15-25] 15 (04/03 0900) BP: (96-144)/(36-86) 102/65 mmHg (04/03 0900) SpO2:  [90 %-100 %] 100 % (04/03 0900) Weight:  [115.667 kg (255 lb)] 115.667 kg (255 lb) (04/02 1524)    Laboratory  CBC  Recent Labs  02/07/15 1700 02/08/15 0254  WBC 20.9* 17.0*  HGB 16.5 12.1*  HCT 47.4 35.5*  PLT 328 199   BMET  Recent Labs  02/07/15 1700 02/08/15 0254  NA 139 138  K 3.1* 4.4  CL 105 104  CO2 21 23  GLUCOSE 124* 135*  BUN 11 9  CREATININE 1.11 0.89  CALCIUM 10.1 8.8    Physical Exam General appearance: alert and no distress Resp: clear to auscultation bilaterally Cardio: regular rate and rhythm GI: normal findings: bowel sounds normal and soft, non-tender Extremities: NVI  C-spine -- NT, no pain with AROM   Assessment/Plan: Fall Concussion Multiple facial lacs -- Local care Bilateral mandible fxs s/p MMF Left wrist/MC fxs s/p ORIF -- NWB but can bear weight through elbow Multiple pelvic fxs -- Non-operative Open left femur/patella fxs s/p ORIF -- NWB, prophylactic abx ABL anemia -- Mild FEN -- Orals for pain VTE -- SCD's, Lovenox Dispo -- PT/OT    Freeman CaldronMichael J. Denson Niccoli, PA-C Pager: [phone] General Trauma PA Pager: [phone]  02/08/2015

## 2015-02-08 NOTE — Evaluation (Signed)
Physical Therapy Evaluation Patient Details Name: [name] SicilianFrank [name] MRN: [id number] DOB: Jul 06, 1996 Today's Date: 02/08/2015   History of Present Illness  19 y.o. 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 He was knocked unconscious. So far injuries include open left femur fracture (s/p ORIF: NWBing), mandibular injuries, left radial head fracture left distal radius fracture left capitate fracture left triquetrum fracture (s/p ORIF: NWBing except through elbow), left LC 1/2 pelvis fracture.  Clinical Impression  Patient demonstrates deficits in functional mobility as indicated below. Will need continued skilled PT to address deficits and maximize functional. Will see as indicated and progress as tolerated. Patient was able to power up to standing with one person assist; anticipate patient will progress well with transfer training and may be able to d/c home at w/c level with assist. Will see acutely but defer further therapies until patient able to start WBing through extremities.  OF NOTE: increased bleeding LLE incision during session, assisted nursing with dressing change, 2 points of bleeding noted. Compression wrap and knee immobilizer reapplied.     Follow Up Recommendations Supervision/Assistance - 24 hour    Equipment Recommendations  3in1 (PT);Wheelchair (measurements PT);Wheelchair cushion (measurements PT)    Recommendations for Other Services       Precautions / Restrictions Precautions Precautions: Fall Required Braces or Orthoses: Knee Immobilizer - Left;Other Brace/Splint Knee Immobilizer - Left: On at all times Other Brace/Splint:  (LUE bivalve cast) Restrictions Weight Bearing Restrictions: Yes LUE Weight Bearing: Weight bear through elbow only (NWBing) LLE Weight Bearing: Non weight bearing      Mobility  Bed Mobility Overal bed mobility: Needs Assistance Bed Mobility: Supine to Sit;Sit to Supine     Supine to sit: Mod  assist Sit to supine: Min assist   General bed mobility comments: Mod assist to elevate trunk off bed to sitting, min assist to mobilize LLE upon return to supine  Transfers Overall transfer level: Needs assistance Equipment used: 1 person hand held assist Transfers: Sit to/from Stand Sit to Stand: Min assist         General transfer comment: performed x3, utilized RUE to power up onto RLE, assisted with LLE support to prevent WBing, min guard for stability once standing  Ambulation/Gait Ambulation/Gait assistance:  (unable to perform)              Stairs            Wheelchair Mobility    Modified Rankin (Stroke Patients Only)       Balance Overall balance assessment: Needs assistance Sitting-balance support: Feet supported Sitting balance-Leahy Scale: Good     Standing balance support: Single extremity supported;During functional activity Standing balance-Leahy Scale: Poor                               Pertinent Vitals/Pain Pain Assessment: 0-10 Pain Score: 6  Pain Location: arm/leg (4/10) Pain Descriptors / Indicators: Constant;Discomfort;Grimacing;Pressure;Sore Pain Intervention(s): Limited activity within patient's tolerance;Premedicated before session;Repositioned;RN gave pain meds during session    Home Living Family/patient expects to be discharged to:: Private residence Living Arrangements: Other relatives (brother) Available Help at Discharge: Family Type of Home: House Home Access: Stairs to enter;Ramped entrance (ramp currently being constructed)     Home Layout: One level        Prior Function Level of Independence: Independent  Hand Dominance   Dominant Hand: Right    Extremity/Trunk Assessment   Upper Extremity Assessment: LUE deficits/detail           Lower Extremity Assessment: LLE deficits/detail         Communication   Communication: No difficulties  Cognition  Arousal/Alertness: Awake/alert Behavior During Therapy: WFL for tasks assessed/performed Overall Cognitive Status: Within Functional Limits for tasks assessed                      General Comments General comments (skin integrity, edema, etc.): patient with increased bleeding from left leg incision, nsg aware, redressed with nsg assist to control bleeding.     Exercises        Assessment/Plan    PT Assessment Patient needs continued PT services  PT Diagnosis Difficulty walking;Acute pain   PT Problem List Decreased strength;Decreased range of motion;Decreased activity tolerance;Decreased balance;Decreased mobility;Pain  PT Treatment Interventions DME instruction;Gait training;Functional mobility training;Therapeutic activities;Therapeutic exercise;Balance training;Patient/family education;Wheelchair mobility training   PT Goals (Current goals can be found in the Care Plan section) Acute Rehab PT Goals Patient Stated Goal: to go home PT Goal Formulation: With patient/family Time For Goal Achievement: 02/22/15 Potential to Achieve Goals: Good    Frequency Min 3X/week   Barriers to discharge        Co-evaluation PT/OT/SLP Co-Evaluation/Treatment: Yes Reason for Co-Treatment: Complexity of the patient's impairments (multi-system involvement);For patient/therapist safety PT goals addressed during session: Mobility/safety with mobility;Balance OT goals addressed during session: ADL's and self-care       End of Session Equipment Utilized During Treatment: Left knee immobilizer;Other (comment) (left arm bivalve cast) Activity Tolerance: Patient tolerated treatment well;Patient limited by pain Patient left: in bed;with call bell/phone within reach;with family/visitor present Nurse Communication: Mobility status;Precautions;Weight bearing status         Time: 6962-9528 PT Time Calculation (min) (ACUTE ONLY): 51 min   Charges:   PT Evaluation $Initial PT Evaluation  Tier I: 1 Procedure     PT G CodesFabio Asa 2015-02-10, 12:18 PM Charlotte Crumb, PT DPT  [phone]

## 2015-02-08 NOTE — Progress Notes (Signed)
02/08/2015 1:08 PM  [name] [name], [name] [id number]  Post-Op Day 1    Temp:  [97.4 F (36.3 C)-98.8 F (37.1 C)] 97.4 F (36.3 C) (04/03 0800) Pulse Rate:  [61-107] 107 (04/03 1000) Resp:  [15-25] 20 (04/03 1000) BP: (96-144)/(36-86) 119/57 mmHg (04/03 1000) SpO2:  [90 %-100 %] 100 % (04/03 1000) Weight:  [115.667 kg (255 lb)] 115.667 kg (255 lb) (04/02 1524),     Intake/Output Summary (Last 24 hours) at 02/08/15 1308 Last data filed at 02/08/15 0900  Gross per 24 hour  Intake 5454.58 ml  Output   4300 ml  Net 1154.58 ml    Results for orders placed or performed during the hospital encounter of 02/07/15 (from the past 24 hour(s))  Type and screen     Status: None   Collection Time: 02/07/15  3:39 PM  Result Value Ref Range   ABO/RH(D) O POS    Antibody Screen NEG    Sample Expiration 02/10/2015   ABO/Rh     Status: None   Collection Time: 02/07/15  3:39 PM  Result Value Ref Range   ABO/RH(D) O POS   I-Stat Chem 8, ED     Status: Abnormal   Collection Time: 02/07/15  3:42 PM  Result Value Ref Range   Sodium 142 135 - 145 mmol/L   Potassium 2.9 (L) 3.5 - 5.1 mmol/L   Chloride 105 96 - 112 mmol/L   BUN 12 6 - 23 mg/dL   Creatinine, Ser 4.091.00 0.50 - 1.35 mg/dL   Glucose, Bld 811135 (H) 70 - 99 mg/dL   Calcium, Ion 9.141.19 7.821.12 - 1.23 mmol/L   TCO2 18 0 - 100 mmol/L   Hemoglobin 17.7 (H) 13.0 - 17.0 g/dL   HCT 95.652.0 21.339.0 - 08.652.0 %  I-Stat CG4 Lactic Acid, ED     Status: Abnormal   Collection Time: 02/07/15  3:42 PM  Result Value Ref Range   Lactic Acid, Venous 3.53 (HH) 0.5 - 2.0 mmol/L   Comment NOTIFIED PHYSICIAN   Ethanol     Status: None   Collection Time: 02/07/15  3:43 PM  Result Value Ref Range   Alcohol, Ethyl (B) <5 0 - 9 mg/dL  Comprehensive metabolic panel     Status: Abnormal   Collection Time: 02/07/15  5:00 PM  Result Value Ref Range   Sodium 139 135 - 145 mmol/L   Potassium 3.1 (L) 3.5 - 5.1 mmol/L   Chloride 105 96 - 112 mmol/L   CO2 21 19 - 32 mmol/L   Glucose, Bld 124 (H) 70 - 99 mg/dL   BUN 11 6 - 23 mg/dL   Creatinine, Ser 5.781.11 0.50 - 1.35 mg/dL   Calcium 46.910.1 8.4 - 62.910.5 mg/dL   Total Protein 7.9 6.0 - 8.3 g/dL   Albumin 4.9 3.5 - 5.2 g/dL   AST 63 (H) 0 - 37 U/L   ALT 45 0 - 53 U/L   Alkaline Phosphatase 90 39 - 117 U/L   Total Bilirubin 0.7 0.3 - 1.2 mg/dL   GFR calc non Af Amer >90 >90 mL/min   GFR calc Af Amer >90 >90 mL/min   Anion gap 13 5 - 15  CBC     Status: Abnormal   Collection Time: 02/07/15  5:00 PM  Result Value Ref Range   WBC 20.9 (H) 4.0 - 10.5 K/uL   RBC 5.43 4.22 - 5.81 MIL/uL   Hemoglobin 16.5 13.0 - 17.0 g/dL   HCT 52.847.4 41.339.0 - 24.452.0 %  MCV 87.3 78.0 - 100.0 fL   MCH 30.4 26.0 - 34.0 pg   MCHC 34.8 30.0 - 36.0 g/dL   RDW 16.1 09.6 - 04.5 %   Platelets 328 150 - 400 K/uL  Protime-INR     Status: None   Collection Time: 02/07/15  5:00 PM  Result Value Ref Range   Prothrombin Time 14.9 11.6 - 15.2 seconds   INR 1.16 0.00 - 1.49  MRSA PCR Screening     Status: None   Collection Time: 02/08/15  1:18 AM  Result Value Ref Range   MRSA by PCR NEGATIVE NEGATIVE  CBC     Status: Abnormal   Collection Time: 02/08/15  2:54 AM  Result Value Ref Range   WBC 17.0 (H) 4.0 - 10.5 K/uL   RBC 4.02 (L) 4.22 - 5.81 MIL/uL   Hemoglobin 12.1 (L) 13.0 - 17.0 g/dL   HCT 40.9 (L) 81.1 - 91.4 %   MCV 88.3 78.0 - 100.0 fL   MCH 30.1 26.0 - 34.0 pg   MCHC 34.1 30.0 - 36.0 g/dL   RDW 78.2 95.6 - 21.3 %   Platelets 199 150 - 400 K/uL  Basic metabolic panel     Status: Abnormal   Collection Time: 02/08/15  2:54 AM  Result Value Ref Range   Sodium 138 135 - 145 mmol/L   Potassium 4.4 3.5 - 5.1 mmol/L   Chloride 104 96 - 112 mmol/L   CO2 23 19 - 32 mmol/L   Glucose, Bld 135 (H) 70 - 99 mg/dL   BUN 9 6 - 23 mg/dL   Creatinine, Ser 0.86 0.50 - 1.35 mg/dL   Calcium 8.8 8.4 - 57.8 mg/dL   GFR calc non Af Amer >90 >90 mL/min   GFR calc Af Amer >90 >90 mL/min   Anion gap 11 5 - 15    SUBJECTIVE:  Pain in multiple  places.  Jaws feel secure.    OBJECTIVE:  Mod facial swelling.  No wirecutters at bedside.  MMF looks secure. Lacerations intact  IMPRESSION:  Satisfactory check.  Needs wirecutters  PLAN:  Panorex not yet done.  Dietician not yet seen.  Needs wirecutters at bedside.  OK for discharge home from my standpoint whenever.    Flo Shanks

## 2015-02-08 NOTE — Evaluation (Signed)
Occupational Therapy Evaluation Patient Details Name: [name] [name] MRN: [id number] DOB: Sep 03, 1996 Today's Date: 02/08/2015    History of Present Illness 19 y.o. 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 He was knocked unconscious. So far injuries include open left femur fracture (s/p ORIF: NWBing), mandibular injuries, left radial head fracture left distal radius fracture left capitate fracture left triquetrum fracture (s/p ORIF: NWBing except through elbow), left LC 1/2 pelvis fracture.   Clinical Impression   This 19 yo male admitted with above presents to acute OT with decreased use of LUE and LLE, NWB'in LUE, LLE, increased pain, decreased mobility, decreased balance all affecting his ability to care for himself at an independent level as he was pta. He will benefit from acute OT without need for follow up.    Follow Up Recommendations  No OT follow up    Equipment Recommendations  3 in 1 bedside comode       Precautions / Restrictions Precautions Precautions: Fall Required Braces or Orthoses: Knee Immobilizer - Left;Other Brace/Splint Knee Immobilizer - Left: On at all times Other Brace/Splint: LUE bi-valve cast with ace wrap Restrictions Weight Bearing Restrictions: Yes LUE Weight Bearing: Weight bear through elbow only LLE Weight Bearing: Non weight bearing      Mobility Bed Mobility Overal bed mobility: Needs Assistance Bed Mobility: Supine to Sit;Sit to Supine     Supine to sit: Mod assist Sit to supine: Min assist   General bed mobility comments: Mod assist to elevate trunk off bed to sitting, min assist to mobilize LLE upon return to supine  Transfers Overall transfer level: Needs assistance Equipment used: 1 person hand held assist Transfers: Sit to/from Stand Sit to Stand: Min assist         General transfer comment: performed x3, utilized RUE to power up onto RLE, assisted with LLE support to prevent WBing, min  guard for stability once standing    Balance Overall balance assessment: Needs assistance Sitting-balance support: Feet supported Sitting balance-Leahy Scale: Good     Standing balance support: Single extremity supported;During functional activity Standing balance-Leahy Scale: Poor                              ADL Overall ADL's : Needs assistance/impaired Eating/Feeding: Set up;Sitting   Grooming: Set up;Sitting   Upper Body Bathing: Minimal assitance;Sitting   Lower Body Bathing: Maximal assistance (with min A sit<>stand)   Upper Body Dressing : Moderate assistance;Sitting   Lower Body Dressing: Total assistance (with min A sit<>stand)                                 Pertinent Vitals/Pain Pain Assessment: 0-10 Pain Score: 6  Pain Location: arm/leg Pain Descriptors / Indicators: Constant;Discomfort;Grimacing;Pressure;Sore Pain Intervention(s): Limited activity within patient's tolerance;Premedicated before session;RN gave pain meds during session     Hand Dominance Right   Extremity/Trunk Assessment Upper Extremity Assessment Upper Extremity Assessment: LUE deficits/detail LUE Deficits / Details: Pt able to move shoulder and fingers within constraint ot casting--educated him on the need to keep doing this so these joints remain mobile so that once cast comes off he only has to focus on joints that are currently immobilized by the cast LUE: Unable to fully assess due to pain;Unable to fully assess due to immobilization LUE Coordination: decreased fine motor;decreased gross motor  Lower Extremity Assessment Lower Extremity Assessment: LLE deficits/detail LLE: Unable to fully assess due to pain;Unable to fully assess due to immobilization       Communication Communication Communication: No difficulties   Cognition Arousal/Alertness: Awake/alert Behavior During Therapy: WFL for tasks assessed/performed Overall Cognitive Status: Within  Functional Limits for tasks assessed                                Home Living Family/patient expects to be discharged to:: Private residence Living Arrangements: Other relatives (brother) Available Help at Discharge: Family Type of Home: House Home Access: Stairs to enter;Ramped entrance     Home Layout: One level         Bathroom Toilet: Standard     Home Equipment: None   Additional Comments: working in roofing       Prior Functioning/Environment Level of Independence: Independent             OT Diagnosis: Generalized weakness;Acute pain   OT Problem List: Decreased range of motion;Impaired balance (sitting and/or standing);Decreased knowledge of use of DME or AE;Impaired UE functional use;Pain   OT Treatment/Interventions: Self-care/ADL training;DME and/or AE instruction;Balance training;Patient/family education    T Goals(Current goals can be found in the care plan section) Acute Rehab OT Goals Patient Stated Goal: to go home OT Goal Formulation: With patient Time For Goal Achievement: 02/15/15 Potential to Achieve Goals: Good  OT Frequency: Min 2X/week           Co-evaluation   Reason for Co-Treatment: Complexity of the patient's impairments (multi-system involvement) PT goals addressed during session: Mobility/safety with mobility;Balance OT goals addressed during session: ADL's and self-care;Strengthening/ROM      End of Session    Activity Tolerance: Patient tolerated treatment well Patient left: in bed;with call bell/phone within reach   Time: 1016-1107 OT Time Calculation (min): 51 min Charges:  OT General Charges $OT Visit: 1 Procedure OT Evaluation $Initial OT Evaluation Tier I: 1 Procedure OT Treatments $Self Care/Home Management : 8-22 mins  [name] [name], [name] [name] 02/08/2015, 2:22 PM

## 2015-02-08 NOTE — Progress Notes (Signed)
     Subjective:  POD#1 ORIF proximal radius fracture, ORIF distal femur fracture, closed reduction radial shaft. Patient reports pain as mild to moderate.  Resting comfortably in bed.    Objective:   VITALS:   Filed Vitals:   02/08/15 0600 02/08/15 0800 02/08/15 0900 02/08/15 1000  BP: 142/65 112/62 102/65 119/57  Pulse: 83 92 104 107  Temp:  97.4 F (36.3 C)    TempSrc:  Oral    Resp: 18 17 15 20   Height:      Weight:      SpO2: 100% 100% 100% 100%    Neurologically intact ABD soft Neurovascular intact Sensation intact distally Intact pulses distally Left leg and left splints intact  Lab Results  Component Value Date   WBC 17.0* 02/08/2015   HGB 12.1* 02/08/2015   HCT 35.5* 02/08/2015   MCV 88.3 02/08/2015   PLT 199 02/08/2015   BMET    Component Value Date/Time   NA 138 02/08/2015 0254   K 4.4 02/08/2015 0254   CL 104 02/08/2015 0254   CO2 23 02/08/2015 0254   GLUCOSE 135* 02/08/2015 0254   BUN 9 02/08/2015 0254   CREATININE 0.89 02/08/2015 0254   CALCIUM 8.8 02/08/2015 0254   GFRNONAA >90 02/08/2015 0254   GFRAA >90 02/08/2015 0254     Assessment/Plan: 1 Day Post-Op   Active Problems:   Fall   Concussion   Bilateral mandibular fracture   Face lacerations   Tooth fractures   Left wrist fracture   Fracture of metacarpal of left hand, closed   Multiple pelvic fractures   Open femur fracture, left   Left patella fracture   Acute blood loss anemia   Up with therapy NWB in the LUE and LLE WBAT in the RLE Continue DVT prophylaxis per medicine   [name] [name] Marie 02/08/2015, 11:42 AM Cell [phone]

## 2015-02-08 NOTE — Anesthesia Postprocedure Evaluation (Signed)
Anesthesia Post Note  Patient: [name] [name]  Procedure(s) Performed: Procedure(s) (LRB): OPEN REDUCTION INTERNAL FIXATION (ORIF) PROXIMAL RADIAL FRACTURE (Left) OPEN REDUCTION INTERNAL FIXATION (ORIF) DISTAL FEMUR FRACTURE CLOSED REDUCTION RADIAL SHAFT, Wrist (Left) CLOSED REDUCTION MANDIBLE WITH MANDIBULOMAXILLARY FUSION (Left) FACIAL LACERATION REPAIR  Anesthesia type: General  Patient location: PACU  Post pain: Pain level controlled and Adequate analgesia  Post assessment: Post-op Vital signs reviewed, Patient's Cardiovascular Status Stable, Respiratory Function Stable, Patent Airway and Pain level controlled  Last Vitals:  Filed Vitals:   02/08/15 0600  BP: 142/65  Pulse: 83  Temp:   Resp: 18    Post vital signs: Reviewed and stable  Level of consciousness: awake, alert  and oriented  Complications: No apparent anesthesia complications

## 2015-02-09 ENCOUNTER — Inpatient Hospital Stay (HOSPITAL_COMMUNITY): Payer: Worker's Compensation

## 2015-02-09 LAB — CBC
HEMATOCRIT: 27.6 % — AB (ref 39.0–52.0)
Hemoglobin: 9.6 g/dL — ABNORMAL LOW (ref 13.0–17.0)
MCH: 30.4 pg (ref 26.0–34.0)
MCHC: 34.8 g/dL (ref 30.0–36.0)
MCV: 87.3 fL (ref 78.0–100.0)
PLATELETS: 171 10*3/uL (ref 150–400)
RBC: 3.16 MIL/uL — ABNORMAL LOW (ref 4.22–5.81)
RDW: 13 % (ref 11.5–15.5)
WBC: 13.1 10*3/uL — ABNORMAL HIGH (ref 4.0–10.5)

## 2015-02-09 MED ORDER — DIPHENHYDRAMINE HCL 12.5 MG/5ML PO ELIX
25.0000 mg | ORAL_SOLUTION | Freq: Four times a day (QID) | ORAL | Status: DC | PRN
  Administered 2015-02-09 – 2015-02-10 (×2): 25 mg via ORAL
  Filled 2015-02-09 (×2): qty 10

## 2015-02-09 MED ORDER — ENSURE ENLIVE PO LIQD: 237.0000 mL | Freq: Three times a day (TID) | ORAL | Status: DC

## 2015-02-09 MED ORDER — ENSURE ENLIVE PO LIQD
237.0000 mL | Freq: Three times a day (TID) | ORAL | Status: DC
  Administered 2015-02-09 – 2015-02-12 (×7): 237 mL via ORAL

## 2015-02-09 MED ORDER — DIPHENHYDRAMINE HCL 25 MG PO CAPS
25.0000 mg | ORAL_CAPSULE | Freq: Four times a day (QID) | ORAL | Status: DC | PRN
  Filled 2015-02-09: qty 1

## 2015-02-09 NOTE — Plan of Care (Signed)
Problem: Food- and Nutrition-Related Knowledge Deficit (NB-1.1) Goal: Nutrition education Formal process to instruct or train a patient/client in a skill or to impart knowledge to help patients/clients voluntarily manage or modify food choices and eating behavior to maintain or improve health. Outcome: Completed/Met Date Met:  02/09/15 RD consulted for diet education regarding jaw fracture.  RD gave "Jaw Fracture Nutrition Therapy" handout from the Academy of [employer] and Dietetics Manual to pt and family. Discussed a full liquid diet at home. Educated on appropriate consistencies for diet. Gave list of foods that are recommended and not recommended for the diet. Emphasized the importance of adequate calorie and protein intake. Encouraged continuation of nutritional supplementation at home, such as carnation instant breakfast, Ensure, or Boost in addition to meals. Discussed ways to increase calories and protein.Teach back method used. RD contact information given.  Expect good compliance.  Kallie Locks, MS, RD, LDN Pager # [phone] After hours/ weekend pager # [phone]

## 2015-02-09 NOTE — Consult Note (Signed)
[name] [name] is an 19 y.o. male.   Chief Complaint: left distal radius and carpal fractures HPI: 19 yo rhd male reportedly fell through roof ~ 30 feet.  Suffered multiple injuries including left wrist fractures.  Left distal radius reduced and splinted by Dr. Percell Miller at time of other procedures.  Reports previous injury to left shoulder but no previous injury to left wrist.  Has left radial head fracture treated with ORIF by Dr. Percell Miller.  Past Medical History  Diagnosis Date  . Aortic valve stenosis   . Mitral valve stenosis     Past Surgical History  Procedure Laterality Date  . Exploration post operative open heart      x 2  . 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 No pertinent family history. Social History:  reports that he has never smoked. He uses smokeless tobacco. His alcohol and drug histories are not on file.  Allergies:  Allergies  Allergen Reactions  . Coconut Oil Rash    Medications Prior to Admission  Medication Sig Dispense Refill  . aspirin EC 325 MG tablet Take 325 mg by mouth daily.      Results for orders placed or performed during the hospital encounter of 02/07/15 (from the past 48 hour(s))  Comprehensive metabolic panel     Status: Abnormal   Collection Time: 02/07/15  5:00 PM  Result Value Ref Range   Sodium 139 135 - 145 mmol/L   Potassium 3.1 (L) 3.5 - 5.1 mmol/L   Chloride 105 96 - 112 mmol/L   CO2 21 19 - 32 mmol/L   Glucose, Bld 124 (H) 70 - 99 mg/dL   BUN 11 6 - 23 mg/dL   Creatinine, Ser 1.11 0.50 - 1.35 mg/dL   Calcium 10.1 8.4 - 10.5 mg/dL   Total Protein 7.9 6.0 - 8.3 g/dL   Albumin 4.9 3.5 - 5.2 g/dL   AST 63 (H) 0 - 37 U/L   ALT 45 0 - 53 U/L   Alkaline Phosphatase 90 39 - 117 U/L   Total Bilirubin 0.7 0.3 - 1.2 mg/dL   GFR calc non Af Amer >90 >90 mL/min   GFR calc Af Amer >90 >90 mL/min    Comment: (NOTE) The eGFR has been calculated using the CKD EPI equation. This calculation has not been  validated in all clinical situations. eGFR's persistently <90 mL/min signify possible Chronic Kidney Disease.    Anion gap 13 5 - 15  CBC     Status: Abnormal   Collection Time: 02/07/15  5:00 PM  Result Value Ref Range   WBC 20.9 (H) 4.0 - 10.5 K/uL   RBC 5.43 4.22 - 5.81 MIL/uL   Hemoglobin 16.5 13.0 - 17.0 g/dL   HCT 47.4 39.0 - 52.0 %   MCV 87.3 78.0 - 100.0 fL   MCH 30.4 26.0 - 34.0 pg   MCHC 34.8 30.0 - 36.0 g/dL   RDW 13.0 11.5 - 15.5 %   Platelets 328 150 - 400 K/uL  Protime-INR     Status: None   Collection Time: 02/07/15  5:00 PM  Result Value Ref Range   Prothrombin Time 14.9 11.6 - 15.2 seconds   INR 1.16 0.00 - 1.49  MRSA PCR Screening     Status: None   Collection Time: 02/08/15  1:18 AM  Result Value Ref Range   MRSA by PCR NEGATIVE NEGATIVE    Comment:  The GeneXpert MRSA Assay (FDA approved for NASAL specimens only), is one component of a comprehensive MRSA colonization surveillance program. It is not intended to diagnose MRSA infection nor to guide or monitor treatment for MRSA infections.   CBC     Status: Abnormal   Collection Time: 02/08/15  2:54 AM  Result Value Ref Range   WBC 17.0 (H) 4.0 - 10.5 K/uL   RBC 4.02 (L) 4.22 - 5.81 MIL/uL   Hemoglobin 12.1 (L) 13.0 - 17.0 g/dL    Comment: REPEATED TO VERIFY   HCT 35.5 (L) 39.0 - 52.0 %   MCV 88.3 78.0 - 100.0 fL   MCH 30.1 26.0 - 34.0 pg   MCHC 34.1 30.0 - 36.0 g/dL   RDW 13.0 11.5 - 15.5 %   Platelets 199 150 - 400 K/uL    Comment: REPEATED TO VERIFY  Basic metabolic panel     Status: Abnormal   Collection Time: 02/08/15  2:54 AM  Result Value Ref Range   Sodium 138 135 - 145 mmol/L   Potassium 4.4 3.5 - 5.1 mmol/L    Comment: DELTA CHECK NOTED   Chloride 104 96 - 112 mmol/L   CO2 23 19 - 32 mmol/L   Glucose, Bld 135 (H) 70 - 99 mg/dL   BUN 9 6 - 23 mg/dL   Creatinine, Ser 0.89 0.50 - 1.35 mg/dL   Calcium 8.8 8.4 - 10.5 mg/dL   GFR calc non Af Amer >90 >90 mL/min   GFR calc Af  Amer >90 >90 mL/min    Comment: (NOTE) The eGFR has been calculated using the CKD EPI equation. This calculation has not been validated in all clinical situations. eGFR's persistently <90 mL/min signify possible Chronic Kidney Disease.    Anion gap 11 5 - 15  CBC     Status: Abnormal   Collection Time: 02/09/15  7:45 AM  Result Value Ref Range   WBC 13.1 (H) 4.0 - 10.5 K/uL    Comment: SPECIMEN CHECKED FOR CLOTS REPEATED TO VERIFY    RBC 3.16 (L) 4.22 - 5.81 MIL/uL   Hemoglobin 9.6 (L) 13.0 - 17.0 g/dL    Comment: SPECIMEN CHECKED FOR CLOTS REPEATED TO VERIFY    HCT 27.6 (L) 39.0 - 52.0 %   MCV 87.3 78.0 - 100.0 fL    Comment: CONSISTENT WITH PREVIOUS RESULT   MCH 30.4 26.0 - 34.0 pg   MCHC 34.8 30.0 - 36.0 g/dL   RDW 13.0 11.5 - 15.5 %   Platelets 171 150 - 400 K/uL    Dg Forearm Left  02/08/2015   CLINICAL DATA:  Postoperative radiograph, status post internal fixation of radial head fracture. Initial encounter.  EXAM: LEFT FOREARM - 2 VIEW  COMPARISON:  Left forearm radiographs performed earlier today at 4:05 p.m.  FINDINGS: A plate and screws are noted along the proximal radius, transfixing the radial head fracture in near anatomic alignment. There is minimal residual displacement of radial head fragments.  There is significantly improved alignment of the distal radius fracture, with residual dorsal displacement of a dorsal fragment. The carpal rows appear grossly intact, and demonstrate normal alignment. The soft tissues are not well assessed due to the overlying cast.  IMPRESSION: 1. Plate and screws along the proximal radius, transfixing the radial head fracture in near anatomic alignment. Minimal residual displacement of radial head fragments. 2. Significantly improved alignment of the distal radius fracture, with residual dorsal displacement of the dorsal fragment.   Electronically Signed  By: Garald Balding M.D.   On: 02/08/2015 02:55   Dg Forearm Left  02/07/2015   CLINICAL  DATA:  Fall through roof from 30 foot height. Left forearm pain and physical deformity. Initial encounter.  EXAM: LEFT FOREARM - 2 VIEW  COMPARISON:  None.  FINDINGS: A comminuted fracture of the distal radius is seen, with involvement of the radiocarpal and distal radial ulnar joints. There is posterior displacement and angulation of the distal radial fracture fragments.  In addition, there is a comminuted fracture of the radial head. No ulnar fracture identified.  IMPRESSION: Comminuted distal radius fracture with dorsal displacement and angulation.  Comminuted fracture of the radial head. Dedicated elbow radiographs recommended for further evaluation.   Electronically Signed   By: Earle Gell M.D.   On: 02/07/2015 17:35   Dg Wrist Complete Left  02/07/2015   CLINICAL DATA:  [name] while working on roof.  EXAM: LEFT WRIST - COMPLETE 3+ VIEW  COMPARISON:  None.  FINDINGS: There is a comminuted intra-articular fracture of the distal radius with marked dorsal displacement, impaction and dorsal angulation. There also is a fracture across the midportion of the triquetrum, minimally displaced. There is a transverse nondisplaced fracture across the capitate. At the radial aspect of the wrist, the scaphoid, trapezoid and trapezium are not well seen on this study due to positioning limitations and rotation.  IMPRESSION: 1. Comminuted intra-articular fracture of the distal radius with marked impaction, displacement and angulation. 2. Transverse fractures across the triquetrum and capitate 3. Limited visibility of the bones at the radial aspect of the wrist.   Electronically Signed   By: Andreas Newport M.D.   On: 02/07/2015 17:38   Dg C-arm 1-60 Min  02/08/2015   CLINICAL DATA:  Internal fixation of left femoral fracture. Initial encounter.  EXAM: LEFT FEMUR 2 VIEWS; DG C-ARM 61-120 MIN  COMPARISON:  Left femur radiographs performed earlier today at 4:06 p.m.  FINDINGS: Seven fluoroscopic C-arm images are provided from  the OR. These demonstrate placement of a plate and screws transfixing the comminuted fracture through the distal femoral metadiaphysis in grossly anatomic alignment. There is minimal step-off at the intercondylar notch.  Scattered soft tissue air is noted.  IMPRESSION: Internal fixation of comminuted fracture through the distal femoral metadiaphysis in grossly anatomic alignment. Minimal residual step-off noted at the intercondylar notch.   Electronically Signed   By: Garald Balding M.D.   On: 02/08/2015 02:29   Dg Femur Min 2 Views Left  02/08/2015   CLINICAL DATA:  Internal fixation of left femoral fracture. Initial encounter.  EXAM: LEFT FEMUR 2 VIEWS; DG C-ARM 61-120 MIN  COMPARISON:  Left femur radiographs performed earlier today at 4:06 p.m.  FINDINGS: Seven fluoroscopic C-arm images are provided from the OR. These demonstrate placement of a plate and screws transfixing the comminuted fracture through the distal femoral metadiaphysis in grossly anatomic alignment. There is minimal step-off at the intercondylar notch.  Scattered soft tissue air is noted.  IMPRESSION: Internal fixation of comminuted fracture through the distal femoral metadiaphysis in grossly anatomic alignment. Minimal residual step-off noted at the intercondylar notch.   Electronically Signed   By: Garald Balding M.D.   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 Open femur fracture.  EXAM: LEFT FEMUR 2 VIEWS  COMPARISON:  None.  FINDINGS: Left pubic ramus fractures noted.  Left sacral fracture noted.  Left distal radial metaphyseal oblique fracture observed with 4.3 cm posterior displacement of the distal fracture fragment with respect to the proximal, and the shaft of the proximal fragment clearing the skin surface. Gas tracks in these musculature posterior to the midshaft and down towards the fracture. There is mild comminution at the fracture site. Based on the  frontal projection, vertical fracture extension in the distal fragment is noted extending into the intercondylar notch. There is some overlap at the fracture site.  There is also known fracture the lateral patella.  IMPRESSION: 1. Open fracture of the distal femur with the proximal shaft fragment extending to the skin surface, with mild comminution, and with a sagittally oriented extension from the main fracture site into the intercondylar notch. There is gas in the regional soft tissues and joint reflecting the open fracture. 2. The lateral patellar fracture is much better seen on today's CT scan. 3. Left pubic ramus and left sacral fractures.   Electronically Signed   By: Van Clines M.D.   On: 02/07/2015 17:43   Dg Femur Port Min 2 Views Left  02/08/2015   CLINICAL DATA:  Postoperative radiograph, status post internal fixation of left femoral fracture. Subsequent encounter.  EXAM: LEFT FEMUR PORTABLE 2 VIEWS  COMPARISON:  Intraoperative radiographs performed earlier today at 8:54 p.m.  FINDINGS: The patient is status post internal fixation of the comminuted left femoral distal metadiaphyseal fracture. Fracture lines are better characterized on the current study. The fracture is noted in grossly anatomic alignment, with 1-2 mm of step-off at the intercondylar notch.  Overlying soft tissue air is seen tracking along the left side. Fractures are again noted through the left superior and inferior pubic rami, demonstrating mild displacement. The left femoral head remains seated at the acetabulum.  IMPRESSION: 1. Status post internal fixation of comminuted left femoral distal metadiaphyseal fracture. Fracture lines are better characterized on the current study. Fracture noted in grossly anatomic alignment, with a 1-2 mm of step-off noted at the intercondylar notch. 2. Mildly displaced fractures again noted through the left superior and inferior pubic rami.   Electronically Signed   By: Garald Balding M.D.    On: 02/08/2015 02:53     A comprehensive review of systems was negative.  Blood pressure 121/61, pulse 106, temperature 97.5 F (36.4 C), temperature source Axillary, resp. rate 18, height 6' 1"  (1.854 m), weight 115.667 kg (255 lb), SpO2 100 %.  General appearance: alert, cooperative and appears stated age Head: Normocephalic, without obvious abnormality Neck: supple, symmetrical, trachea midline Extremities: intact sensation and capillary refill all digits.  +epl/fpl/io.  right ue: no ttp.  left ue: forearm splinted.  dressing clean/dry/intact. Pulses: 2+ and symmetric Skin: Skin color, texture, turgor normal. No rashes or lesions Neurologic: Grossly normal Incision/Wound: none  Assessment/Plan Left distal radius fracture with comminution with capitate and triquetrum fractures.  Will order CT for further characterization of the fracture.  Discussed nature of the injury with patient and his mother who is present with him.  This will likely require fixation of the carpus and distal radius, possibly with bridge plating.  High likelihood of multiple procedures.  He would like to consolidate the care for his left wrist back home in [place], which would be appropriate.  Patient and mother given a list of hand surgeons close to his home in [place].    Keats Kingry R 02/09/2015, 4:44 PM

## 2015-02-09 NOTE — Progress Notes (Signed)
     Subjective:  POD#2 ORIF L proximal radius fracture, ORIF L distal femur fracture, and L closed reduction radial shaft. Patient reports pain as moderate.  Resting comfortably in bed.  Worked with PT/OT yesterday.  Objective:   VITALS:   Filed Vitals:   02/08/15 1000 02/08/15 1352 02/08/15 2109 02/09/15 0509  BP: 119/57 134/63 111/57 108/54  Pulse: 107 92 106 89  Temp:  97.4 F (36.3 C) 98 F (36.7 C) 97.3 F (36.3 C)  TempSrc:  Oral Axillary Axillary  Resp: 20 18 18 16   Height:      Weight:      SpO2: 100% 100% 100% 100%    Neurologically intact ABD soft Neurovascular intact Sensation intact distally Intact pulses distally Left leg and left arm splints intact  Lab Results  Component Value Date   WBC 17.0* 02/08/2015   HGB 12.1* 02/08/2015   HCT 35.5* 02/08/2015   MCV 88.3 02/08/2015   PLT 199 02/08/2015   BMET    Component Value Date/Time   NA 138 02/08/2015 0254   K 4.4 02/08/2015 0254   CL 104 02/08/2015 0254   CO2 23 02/08/2015 0254   GLUCOSE 135* 02/08/2015 0254   BUN 9 02/08/2015 0254   CREATININE 0.89 02/08/2015 0254   CALCIUM 8.8 02/08/2015 0254   GFRNONAA >90 02/08/2015 0254   GFRAA >90 02/08/2015 0254     Assessment/Plan: 2 Days Post-Op   Active Problems:   Fall   Concussion   Bilateral mandibular fracture   Face lacerations   Tooth fractures   Left wrist fracture   Fracture of metacarpal of left hand, closed   Multiple pelvic fractures   Open femur fracture, left   Left patella fracture   Acute blood loss anemia   Up with therapy NWB in the LLE and LUE WBAT in the RLE Continue DVT prophylaxis per medicine.  Daisean Brodhead Hilda LiasMarie 02/09/2015, 7:24 AM Cell [phone]

## 2015-02-09 NOTE — Progress Notes (Signed)
02/09/2015 1:23 PM  Brzozowski, Homero FellersFrank [phone]  Post-Op Day 2    Temp:  [97.3 F (36.3 C)-98 F (36.7 C)] 97.5 F (36.4 C) (04/04 1300) Pulse Rate:  [89-106] 106 (04/04 1300) Resp:  [16-18] 18 (04/04 1300) BP: (108-134)/(54-63) 121/61 mmHg (04/04 1300) SpO2:  [100 %] 100 % (04/04 1300),     Intake/Output Summary (Last 24 hours) at 02/09/15 1323 Last data filed at 02/09/15 0900  Gross per 24 hour  Intake    600 ml  Output   1775 ml  Net  -1175 ml    Results for orders placed or performed during the hospital encounter of 02/07/15 (from the past 24 hour(s))  CBC     Status: Abnormal   Collection Time: 02/09/15  7:45 AM  Result Value Ref Range   WBC 13.1 (H) 4.0 - 10.5 K/uL   RBC 3.16 (L) 4.22 - 5.81 MIL/uL   Hemoglobin 9.6 (L) 13.0 - 17.0 g/dL   HCT 56.227.6 (L) 13.039.0 - 86.552.0 %   MCV 87.3 78.0 - 100.0 fL   MCH 30.4 26.0 - 34.0 pg   MCHC 34.8 30.0 - 36.0 g/dL   RDW 78.413.0 69.611.5 - 29.515.5 %   Platelets 171 150 - 400 K/uL    SUBJECTIVE:  Min mouth pain.  No change since yest. Has seen the Dietitian.  OBJECTIVE:  MMF secure.  IMPRESSION:  Satisfactory check.  Still no [employer]!  Ordered for the 3rd time.   PLAN:  [employer]. OK to travel to [facility] from my standpoint.  Flo ShanksWOLICKI, Kerrianne Jeng

## 2015-02-09 NOTE — Progress Notes (Signed)
Occupational Therapy Treatment Patient Details Name: [name] [name] MRN: [id number] DOB: 1996-04-17 Today's Date: 02/09/2015    History of present illness 19 y.o. 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 He was knocked unconscious. So far injuries include open left femur fracture (s/p ORIF: NWBing), mandibular injuries, left radial head fracture left distal radius fracture left capitate fracture left triquetrum fracture (s/p ORIF: NWBing except through elbow), left LC 1/2 pelvis fracture.   OT comments  Pt required elevated surface for sit<>Stand and will need to be able to complete w/c transfer prior to d/c home. Pt currently required total +2 (A) for safety and bed elevation. Ot to continue to follow acutely.  Pain control limiting factor    Follow Up Recommendations  Home health OT    Equipment Recommendations  3 in 1 bedside comode    Recommendations for Other Services      Precautions / Restrictions Precautions Precautions: Fall Required Braces or Orthoses: Knee Immobilizer - Left;Other Brace/Splint Knee Immobilizer - Left: On at all times Other Brace/Splint: LUE bi-valve cast with ace wrap Restrictions Weight Bearing Restrictions: Yes LUE Weight Bearing: Weight bear through elbow only LLE Weight Bearing: Non weight bearing       Mobility Bed Mobility Overal bed mobility: +2 for physical assistance;Needs Assistance Bed Mobility: Supine to Sit;Sit to Supine     Supine to sit: +2 for physical assistance;Mod assist Sit to supine: +2 for physical assistance;Mod assist   General bed mobility comments: pt required max cueing for sequence and to scoot L LE toward EOB. Pt needed (A) to complete hip flexion to sit.  Transfers Overall transfer level: Needs assistance Equipment used: Left platform walker Transfers: Sit to/from Stand Sit to Stand: Max assist (due to LLE support)         General transfer comment: see above note     Balance Overall balance assessment: Needs assistance         Standing balance support: Bilateral upper extremity supported;During functional activity Standing balance-Leahy Scale: Poor                     ADL Overall ADL's : Needs assistance/impaired     Grooming: Wash/dry hands;Wash/dry face;Modified independent;Bed level                                 General ADL Comments: Pt supine on arrival and very anxious about mobility. Pt concerned with having 2 people present due to size of patient. Pt educated on sequence for OOB to stand and use of platform RW. pt required bed elevated greater than 30 inches to complete sit<>stand max (A) due to holding LLE. Pt tolerating standing for ~3 minutes for bed linen change. Pt very worried about positioning of L UE. Pt with pain control issues this AM.       Vision                     Perception     Praxis      Cognition   Behavior During Therapy: [facility] for tasks assessed/performed Overall Cognitive Status: Within Functional Limits for tasks assessed                       Extremity/Trunk Assessment               Exercises  Shoulder Instructions       General Comments      Pertinent Vitals/ Pain       Pain Assessment: No/denies pain Pain Score: 8  Pain Location: L UE / LE Pain Descriptors / Indicators: Constant Pain Intervention(s): Monitored during session;Premedicated before session  Home Living                     Bathroom Shower/Tub: Walk-in shower;Door                    Prior Functioning/Environment              Frequency Min 2X/week     Progress Toward Goals  OT Goals(current goals can now be found in the care plan section)  Progress towards OT goals: Progressing toward goals  Acute Rehab OT Goals Patient Stated Goal: to go home OT Goal Formulation: With patient Time For Goal Achievement: 02/15/15 Potential to Achieve Goals: Good ADL  Goals Pt Will Perform Lower Body Bathing: with caregiver independent in assisting Pt Will Perform Lower Body Dressing: with caregiver independent in assisting;with adaptive equipment;sit to/from stand Pt Will Transfer to Toilet: with min assist;stand pivot transfer;bedside commode Pt Will Perform Toileting - Clothing Manipulation and hygiene: with min assist;with caregiver independent in assisting;sit to/from stand Additional ADL Goal #1: Pt will be min A for in and OOB for BADLs (or up/down from recliner--may sleep in this at home)  Plan Discharge plan remains appropriate    Co-evaluation                 End of Session Equipment Utilized During Treatment: Gait belt;Rolling walker   Activity Tolerance Patient limited by pain   Patient Left in bed;with call bell/phone within reach;with family/visitor present   Nurse Communication Mobility status;Precautions        Time: [id number] OT Time Calculation (min): 55 min  Charges: OT General Charges $OT Visit: 1 Procedure OT Treatments $Self Care/Home Management : 53-67 mins  Boone MasterJones, Anikah Hogge B 02/09/2015, 10:44 AM  Pager: [phone]

## 2015-02-09 NOTE — Progress Notes (Signed)
INITIAL NUTRITION ASSESSMENT  DOCUMENTATION CODES Per approved criteria  -Obesity Unspecified   INTERVENTION: Provide Ensure Enlive po TID, each supplement provides 350 kcal and 20 grams of protein.  Encourage adequate PO intake.  NUTRITION DIAGNOSIS: Inadequate oral intake related to difficulty eating/drinking as evidenced by jaw fracture and meal completion of 50%.   Goal: Pt to meet >/= 90% of their estimated nutrition needs   Monitor:  PO intake, weight trends, labs, I/O's  Reason for Assessment: MD consult  19 y.o. 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 C/o of jaw pain, loose teeth, L wrist/leg pain. PMH of aortic valve stenosis, mitral valve stenosis.   PROCEDURE (4/2): OPEN REDUCTION INTERNAL FIXATION (ORIF) PROXIMAL RADIAL FRACTURE, OPEN REDUCTION INTERNAL FIXATION (ORIF) DISTAL FEMUR FRACTURE, CLOSED REDUCTION RADIAL SHAFT, Wrist, CLOSED REDUCTION MANDIBLE WITH MANDIBULOMAXILLARY FUSION, FACIAL LACERATION REPAIR  Pt reports difficulty eating/drinking as mouth has been swollen. Meal completion has been 50% via straw. Pt reports appetite is fine. Weight stable. Family at bedside. Pt is agreeable on trying Ensure to aid in caloric and protein needs. RD to order. RD was additionally consulted for diet education regarding jaw fracture. Education given.  Pt with no observed significant fat or muscle mass loss.  Labs and medications reviewed.  Height: Ht Readings from Last 1 Encounters:  02/07/15  (1.854 m) (89 %*, Z = 1.23)   * Growth percentiles are based on CDC 2-20 Years data.    Weight: Wt Readings from Last 1 Encounters:  02/07/15 255 lb (115.667 kg) (99 %*, Z = 2.49)   * Growth percentiles are based on CDC 2-20 Years data.    Ideal Body Weight: 184 lbs  % Ideal Body Weight: 139%  Wt Readings from Last  10 Encounters:  02/07/15 255 lb (115.667 kg) (99 %*, Z = 2.49)   * Growth percentiles are based on CDC 2-20 Years data.    Usual Body Weight: 255 lbs  % Usual Body Weight: 100%  BMI:  Body mass index is 33.65 kg/(m^2). Class I obesity  Estimated Nutritional Needs: Kcal: 2200-2400 Protein: 120-140 grams Fluid: 2.2 - 2.4 L/day  Skin: Incision on left arm, face, and thigh, non-pitting LUE edema  Diet Order: Diet full liquid Room service appropriate?: Yes; Fluid consistency:: Thin  EDUCATION NEEDS: -Education needs addressed   Intake/Output Summary (Last 24 hours) at 02/09/15 1037 Last data filed at 02/09/15 0900  Gross per 24 hour  Intake    600 ml  Output   1775 ml  Net  -1175 ml    Last BM: PTA  Labs:   Recent Labs Lab 02/07/15 1542 02/07/15 1700 02/08/15 0254  NA 142 139 138  K 2.9* 3.1* 4.4  CL 105 105 104  CO2  --  21 23  BUN CREATININE 1.00 1.11 0.89  CALCIUM  --  10.1 8.8  GLUCOSE 135* 124* 135*    CBG (last 3)  No results for input(s): GLUCAP in the last 72 hours.  Scheduled Meds: . chlorhexidine  5 mL Mouth/Throat QID  . enoxaparin (LOVENOX) injection  30 mg Subcutaneous Q12H    Continuous Infusions:   Past Medical History  Diagnosis Date  . Aortic valve stenosis   . Mitral valve stenosis     Past Surgical History  Procedure Laterality Date  . Exploration post operative  open heart      x 2  . 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

## 2015-02-09 NOTE — Progress Notes (Signed)
Physical Therapy Treatment Patient Details Name: Granite Godman MRN: [id number] DOB: 10/19/1996 Today's Date: 02/09/2015    History of Present Illness 19 y.o. 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 He was knocked unconscious. So far injuries include open left femur fracture (s/p ORIF: NWBing), mandibular injuries, left radial head fracture left distal radius fracture left capitate fracture left triquetrum fracture (s/p ORIF: NWBing except through elbow), left LC 1/2 pelvis fracture.    PT Comments    Pt is appears to be extremely anxious about breaking his WB precautions which results in hesitancy to follow max verbal cues for proper placement and sequencing of BLEs and BUEs during all mobility.  Pt requests LLE to be elevated during transfers.  Pt required +2 assist for bed mobility, sit<>stand, and stand pivot transfer.  PT provided assurance of accomplishment at end of session as pt became emotionally upset and crying.     Follow Up Recommendations  Supervision/Assistance - 24 hour     Equipment Recommendations  3in1 (PT);Wheelchair (measurements PT);Wheelchair cushion (measurements PT)    Recommendations for Other Services       Precautions / Restrictions Precautions Precautions: Fall Required Braces or Orthoses: Knee Immobilizer - Left;Other Brace/Splint Knee Immobilizer - Left: On at all times Other Brace/Splint: LUE bi-valve cast with ace wrap Restrictions Weight Bearing Restrictions: Yes LUE Weight Bearing: Weight bear through elbow only LLE Weight Bearing: Non weight bearing    Mobility  Bed Mobility Overal bed mobility: +2 for physical assistance;Needs Assistance Bed Mobility: Supine to Sit     Supine to sit: +2 for physical assistance;Mod assist Sit to supine: +2 for physical assistance;Mod assist   General bed mobility comments: Pt required max verbal cues for sequencing during supine>sit and required mod assist w/ use of  bed pad and supporting trunk posteriorly.  Required verbal cues to push through RUE on bed to scoot one hip toward EOB at a time.  Transfers Overall transfer level: Needs assistance Equipment used: Left platform walker Transfers: Sit to/from Stand;Stand Pivot Transfers Sit to Stand: Max assist;+2 physical assistance Stand pivot transfers: Mod assist;+2 physical assistance       General transfer comment: Required assist to keep LLE elevated during sit<>stand.  Max verbal cues for proper hand placement and technique to push through RLE and RUE to come to standing.    Ambulation/Gait Ambulation/Gait assistance:  (pivotal steps only)               Stairs            Wheelchair Mobility    Modified Rankin (Stroke Patients Only)       Balance Overall balance assessment: Needs assistance Sitting-balance support: Feet supported;Single extremity supported Sitting balance-Leahy Scale: Fair     Standing balance support: Bilateral upper extremity supported Standing balance-Leahy Scale: Poor                      Cognition Arousal/Alertness: Awake/alert Behavior During Therapy: Agitated Overall Cognitive Status: Within Functional Limits for tasks assessed                      Exercises      General Comments General comments (skin integrity, edema, etc.): Pt appears to be extremely fearful of breaking his WB precautions and this is reflected in pt's questioning if he should be standing up and moving.  Pt requires max verbal cues throughout session on proper positioing  on BUEs and BLEs and requires reassurance at the end of session of his accomplishment.        Pertinent Vitals/Pain Pain Assessment: Faces Pain Score:  (Pt did not report a number) Faces Pain Scale: Hurts worst Pain Location: L UE/LE Pain Descriptors / Indicators: Grimacing;Crying;Constant Pain Intervention(s): Limited activity within patient's tolerance;Monitored during  session;Repositioned    Home Living                      Prior Function            PT Goals (current goals can now be found in the care plan section) Acute Rehab PT Goals Patient Stated Goal: none stated PT Goal Formulation: With patient/family Time For Goal Achievement: 02/22/15 Potential to Achieve Goals: Good Progress towards PT goals: Progressing toward goals    Frequency  Min 3X/week    PT Plan Current plan remains appropriate    Co-evaluation             End of Session Equipment Utilized During Treatment: Left knee immobilizer;Other (comment) (left arm bivalve cast) Activity Tolerance: Patient limited by fatigue;Patient limited by pain;Treatment limited secondary to agitation Patient left: in chair;with call bell/phone within reach;with family/visitor present     Time: 1020-1040 PT Time Calculation (min) (ACUTE ONLY): 20 min  Charges:  $Therapeutic Activity: 8-22 mins                    G CodesMichail Jewels:      Kataleena Holsapple Parr PT, [place] [phone]  [phone] 02/09/2015, 1:11 PM

## 2015-02-09 NOTE — Progress Notes (Signed)
Patient ID: [name] [name], male   DOB: 1996-03-28, 19 y.o.   MRN: [id number]   LOS: 2 days   Subjective: No new c/o.   Objective: Vital signs in last 24 hours: Temp:  [97.3 F (36.3 C)-98 F (36.7 C)] 97.3 F (36.3 C) (04/04 0509) Pulse Rate:  [89-107] 89 (04/04 0509) Resp:  [16-20] 16 (04/04 0509) BP: (108-134)/(54-63) 108/54 mmHg (04/04 0509) SpO2:  [100 %] 100 % (04/04 0509) Last BM Date:  (PTA)   Physical Exam General appearance: alert and no distress Resp: clear to auscultation bilaterally Cardio: regular rate and rhythm GI: normal findings: bowel sounds normal and soft, non-tender Extremities: NVI   Assessment/Plan: Fall Concussion Multiple facial lacs -- Local care Bilateral mandible fxs s/p MMF Left wrist/MC fxs s/p ORIF -- NWB but can bear weight through elbow Multiple pelvic fxs -- Non-operative Open left femur/patella fxs s/p ORIF -- NWB, prophylactic abx ABL anemia -- Mild FEN -- Orals for pain VTE -- SCD's, Lovenox Dispo -- PT/OT, [place]    [name] CaldronMichael J. Rashmi Tallent, PA-C Pager: [phone] General Trauma PA Pager: [phone]  02/09/2015

## 2015-02-09 NOTE — Clinical Social Work Note (Signed)
Clinical Social Work Department BRIEF PSYCHOSOCIAL ASSESSMENT 02/09/2015  Patient:  [name] [name], [name] [name]     Account Number:  [id number]     Admit date:  02/07/2015  Clinical Social Worker:  Myles Lipps  Date/Time:  02/09/2015 03:30 PM  Referred by:  Physician  Date Referred:  02/09/2015 Referred for  Psychosocial assessment   Other Referral:   Interview type:  Patient Other interview type:   No family/friends at bedside during assessment    PSYCHOSOCIAL DATA Living Status:  FAMILY Admitted from facility:   Level of care:   Primary support name:  [name],[name]  [phone] Primary support relationship to patient:  PARENT Degree of support available:   Appropriate and supportive    CURRENT CONCERNS Current Concerns  None Noted   Other Concerns:    SOCIAL WORK ASSESSMENT / PLAN Clinical Social Worker met with patient at bedside to offer support and discuss patient needs at discharge.  Patient states that he was on a job site when he fell through a roof about 30 ft.  Patient states that the accident happened while at work and his supervisor has already filed a [employer].  Patient is from [place] with his father and step brothers and plans to return there at discharge.  Patient brothers work alternating shifts and will be available to assist 24/7. Patient states that his supervisor has agreed to to drive patient home with company [employer] once medically stable.    Clinical Social Worker inquired about current substance use.  Patient states that he does not drink or use any type of drugs.  Patient states that his main focus is to work and make money.  SBIRT completed and no further resources necessary.  CSW signing off.  Please reconsult if further needs arise prior to discharge.   Assessment/plan status:  No Further Intervention Required Other assessment/ plan:   Information/referral to community resources:   CM to address patient Worker's Compensation for  home health and equipment prior to discharge home.    PATIENT'S/FAMILY'S RESPONSE TO PLAN OF CARE: Patient alert and oriented x3 laying in bed resting upon CSW arrival.  Patient states that he has good family support, however it has been hard with him being away from home.  Patient is agreeable with home health and transportation back to [facility] with his supervisor.  Patient mood and interaction are far beyond the extent of his injuries.  Patient engaged and willing to participate in conversation.  Patient verbalized understanding of CSW role and appreciation for support and concern.

## 2015-02-09 NOTE — Progress Notes (Signed)
Loss of IV access while moving patient back to bed. Discussed with patient that at this time I did not have to re-insert an IV as he was not requiring IV medications or fluid. Patient stated that he did not want another IV unless he had to have one.

## 2015-02-09 NOTE — Progress Notes (Signed)
Orthopedic Tech Progress Note Patient Details:  [name] [name] December 08, 1995 [id number] Pt. is unable to use OHF with trapeze due to upper extremity injury. Patient ID: [name] SicilianFrank [name], male   DOB: December 08, 1995, 19 y.o.   MRN: [id number]   Lesle ChrisGilliland, Emily Forse L 02/09/2015, 1:25 PM

## 2015-02-10 ENCOUNTER — Inpatient Hospital Stay (HOSPITAL_COMMUNITY): Payer: Worker's Compensation

## 2015-02-10 ENCOUNTER — Encounter (HOSPITAL_COMMUNITY): Payer: Self-pay | Admitting: General Practice

## 2015-02-10 NOTE — Progress Notes (Signed)
Occupational Therapy Treatment Patient Details Name: [name] [name] MRN: [id number] DOB: [birth year] Today's Date: 02/10/2015    History of present illness 19 y.o. 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 He was knocked unconscious. So far injuries include open left femur fracture (s/p ORIF: NWBing), mandibular injuries, left radial head fracture left distal radius fracture left capitate fracture left triquetrum fracture (s/p ORIF: NWBing except through elbow), left LC 1/2 pelvis fracture.   OT comments  Pt plans to d/c home to [facility] today via [place]. Pt and family educated to have extra pillows and blankets for the long ride to help with positioning and comfort. Session focused on basic transfer and bed level bathing as precursor to transfer into van. Pt educated on the need for two therapy sessions to ensure basic transfer in and out of the [facility] will be possible. Pt in agreement. Pt smiling this session and eager to learn.    Follow Up Recommendations  Home health OT    Equipment Recommendations  3 in 1 bedside comode    Recommendations for Other Services      Precautions / Restrictions Precautions Precautions: Fall Required Braces or Orthoses: Knee Immobilizer - Left;Other Brace/Splint Knee Immobilizer - Left: On at all times Other Brace/Splint: LUE bi-valve cast with ace wrap Restrictions Weight Bearing Restrictions: Yes LUE Weight Bearing: Weight bear through elbow only LLE Weight Bearing: Non weight bearing       Mobility Bed Mobility Overal bed mobility: + 2 for safety/equipment;Needs Assistance Bed Mobility: Supine to Sit     Supine to sit: +2 for physical assistance;Mod assist     General bed mobility comments: cues for sequence and EOB . Pt needed max (A) to help assist L LE toward EOB  Transfers Overall transfer level: Needs assistance Equipment used: Left platform walker Transfers: Sit to/from Stand Sit to Stand: Mod  assist;From elevated surface;+2 safety/equipment Stand pivot transfers: Mod assist;+2 safety/equipment;From elevated surface       General transfer comment: pt requires bed surface elevated and (A) to power up into standing    Balance Overall balance assessment: Needs assistance Sitting-balance support: Single extremity supported;Feet supported Sitting balance-Leahy Scale: Fair                             ADL Overall ADL's : Needs assistance/impaired Eating/Feeding: Set up;Sitting   Grooming: Wash/dry hands;Wash/dry face;Oral care;Sitting;Bed level;Minimal assistance Grooming Details (indicate cue type and reason): requires (A) for setup and opening items Upper Body Bathing: Moderate assistance;Bed level Upper Body Bathing Details (indicate cue type and reason): (A) for R side due to L UE injury Lower Body Bathing: Maximal assistance;Bed level   Upper Body Dressing : Minimal assistance;Bed level       Toilet Transfer: +2 for safety/equipment;Stand-pivot;RW           Functional mobility during ADLs: +2 for safety/equipment;Rolling walker (platform stand pivot) General ADL Comments: Pt very motivated this session and eager to d/c back to [place] today. pt with pain under control and reports " i feel better now that I know for sure I can use this arm" Pt required (A) to scoot toward eob and Max v/c to sequence. pt tolerating unsupported sitting min guard (A) . Pt will require (A) for all adls      Vision  Perception     Praxis      Cognition   Behavior During Therapy: WFL for tasks assessed/performed Overall Cognitive Status: Within Functional Limits for tasks assessed                       Extremity/Trunk Assessment               Exercises     Shoulder Instructions       General Comments      Pertinent Vitals/ Pain       Pain Assessment: 0-10 Pain Score: 2  Pain Location: arm and Le L Pain Descriptors  / Indicators: Dull;Discomfort Pain Intervention(s): Monitored during session;Premedicated before session  Home Living                                          Prior Functioning/Environment              Frequency Min 2X/week     Progress Toward Goals  OT Goals(current goals can now be found in the care plan section)  Progress towards OT goals: Progressing toward goals  Acute Rehab OT Goals Patient Stated Goal: none stated OT Goal Formulation: With patient Time For Goal Achievement: 02/15/15 Potential to Achieve Goals: Good ADL Goals Pt Will Perform Lower Body Bathing: with caregiver independent in assisting Pt Will Perform Lower Body Dressing: with caregiver independent in assisting;with adaptive equipment;sit to/from stand Pt Will Transfer to Toilet: with min assist;stand pivot transfer;bedside commode Pt Will Perform Toileting - Clothing Manipulation and hygiene: with min assist;with caregiver independent in assisting;sit to/from stand Additional ADL Goal #1: Pt will be min A for in and OOB for BADLs (or up/down from recliner--may sleep in this at home)  Plan Discharge plan remains appropriate    Co-evaluation                 End of Session Equipment Utilized During Treatment: Rolling walker   Activity Tolerance Patient tolerated treatment well   Patient Left in chair;with call bell/phone within reach;with family/visitor present   Nurse Communication Mobility status;Precautions        Time: [id number] OT Time Calculation (min): 26 min  Charges: OT General Charges $OT Visit: 1 Procedure OT Treatments $Self Care/Home Management : 23-37 mins  Harolyn RutherfordJones, Sharae Zappulla B 02/10/2015, 9:27 AM Pager: [phone]

## 2015-02-10 NOTE — Progress Notes (Signed)
Physical Therapy Treatment Patient Details Name: Darlina SicilianFrank Ketcham MRN: [id number] DOB: 1996-02-24 Today's Date: 02/10/2015    History of Present Illness 19 y.o. 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 He was knocked unconscious. So far injuries include open left femur fracture (s/p ORIF: NWBing), mandibular injuries, left radial head fracture left distal radius fracture left capitate fracture left triquetrum fracture (s/p ORIF: NWBing except through elbow), left LC 1/2 pelvis fracture.    PT Comments    Pt demonstrated increased tolerance for sit<>stand transfers today and benefited from using rocking technique prior to standing.  Attempted hopping/stepping in place x3 and was successful x1 w/ poor foot clearance.  Began WC mobility training this session (see notes below).  PT session ended w/ pt back in bed w/ RN in room w/ RN report that pt is to go to [place].   Follow Up Recommendations  Supervision/Assistance - 24 hour     Equipment Recommendations  3in1 (PT);Wheelchair (measurements PT);Wheelchair cushion (measurements PT);Rolling walker with 5" wheels (L platform walker)    Recommendations for Other Services       Precautions / Restrictions Precautions Precautions: Fall Required Braces or Orthoses: Knee Immobilizer - Left;Other Brace/Splint Knee Immobilizer - Left: On at all times Other Brace/Splint: LUE bi-valve cast with ace wrap Restrictions Weight Bearing Restrictions: Yes LUE Weight Bearing: Weight bear through elbow only LLE Weight Bearing: Non weight bearing    Mobility  Bed Mobility Overal bed mobility: +2 for physical assistance;Needs Assistance Bed Mobility: Sit to Supine     Supine to sit: +2 for physical assistance;Mod assist Sit to supine: +2 for physical assistance;Mod assist   General bed mobility comments: Pt required assist +2 for scooting toward HOB using bed pad and pt pushing through RLE.  Assistance w/ bringing LLE  into bed, pt says "I can't move my left leg"  Transfers Overall transfer level: Needs assistance Equipment used: Left platform walker Transfers: Sit to/from Stand;Stand Pivot Transfers Sit to Stand: +2 physical assistance;Mod assist Stand pivot transfers: Mod assist;+2 safety/equipment       General transfer comment: Used rocking technique for sit>stand and required verbal cues for proper positioning of BLEs and BUEs during sit<>stand.  Pt requires assist w/ bring LLE out in front during sit<>stand.  Ambulation/Gait             General Gait Details: pivotal steps/shuffle only.  Pt educated on technique to hop and step on RLE by pushing through RUE and L elbow.  Pt w/ 3 attempts for hop and successful w/ 1 hop in place w/ poor foot clearance.     Stairs            Merchant navy officerWheelchair Mobility Wheelchair Mobility Wheelchair mobility: Yes Wheelchair propulsion: Right upper extremity;Right lower extremity Wheelchair parts: Needs assistance Distance: 10 Wheelchair Assistance Details (indicate cue type and reason): cues for proper positioning of RLE to successfully navigate WC to the R as pt continues to veer to the L by using RUE.  Pt verbalized understanding but required assist 2/2 depth (too deep) of WC limiting pt's ability to completely put R foot flat on the floor for optimal leverage.     Modified Rankin (Stroke Patients Only)       Balance Overall balance assessment: Needs assistance Sitting-balance support: Single extremity supported;Feet supported Sitting balance-Leahy Scale: Fair     Standing balance support: Bilateral upper extremity supported Standing balance-Leahy Scale: Poor Standing balance comment: Pt w/ slight  loss of balance while standing EOB 2/2 not moving RW with him as he backs up to bed, required verbal cues and min assist posterior and L laterally to maintain balance in standing.                    Cognition Arousal/Alertness:  Awake/alert Behavior During Therapy: WFL for tasks assessed/performed Overall Cognitive Status: Within Functional Limits for tasks assessed                      Exercises      General Comments General comments (skin integrity, edema, etc.): Pt becomes discouraged quickly and requires max encouragement and education to continue attempting WC mobility.      Pertinent Vitals/Pain Pain Assessment: Faces Pain Score: 2  Faces Pain Scale: Hurts even more Pain Location: pt did not specify Pain Descriptors / Indicators: Grimacing;Moaning Pain Intervention(s): Limited activity within patient's tolerance;Monitored during session;Repositioned    Home Living Family/patient expects to be discharged to:: Unsure Living Arrangements: Parent                  Prior Function            PT Goals (current goals can now be found in the care plan section) Acute Rehab PT Goals Patient Stated Goal: none stated PT Goal Formulation: With patient Time For Goal Achievement: 02/22/15 Potential to Achieve Goals: Good Progress towards PT goals: Progressing toward goals    Frequency  Min 3X/week    PT Plan Current plan remains appropriate;Equipment recommendations need to be updated    Co-evaluation             End of Session Equipment Utilized During Treatment: Left knee immobilizer;Other (comment);Gait belt (L arm bivalve cast) Activity Tolerance: Patient limited by fatigue;Patient tolerated treatment well Patient left: in bed;with call bell/phone within reach;with nursing/sitter in room;with SCD's reapplied     Time: 1035-1106 PT Time Calculation (min) (ACUTE ONLY): 31 min  Charges:  $Therapeutic Activity: 8-22 mins $Wheel Chair Management: 8-22 mins                    G CodesMichail Jewels PT, [place] [phone]  [phone] 02/10/2015, 11:33 AM

## 2015-02-10 NOTE — Progress Notes (Signed)
Patient ID: [name] [name], male   DOB: 14-Apr-1996, 19 y.o.   MRN: [id number]   LOS: 3 days   Subjective: No new c/o. Is confused about WB status of left elbow; [name] said he initially could but PA today wrote no and pt has been told both. Hand surgery recommends surgery on wrist/hand but said it could be [place] later and [place] [place]'s [place].   Objective: Vital signs in last 24 hours: Temp:  [97.5 F (36.4 C)-97.8 F (36.6 C)] 97.8 F (36.6 C) (04/05 0509) Pulse Rate:  [106-126] 126 (04/05 0509) Resp:  [16-18] 16 (04/05 0509) BP: (116-126)/(54-76) 126/54 mmHg (04/05 0509) SpO2:  [93 %-100 %] 100 % (04/05 0509) Last BM Date: 02/07/15   Physical Exam General appearance: alert and no distress Resp: clear to auscultation bilaterally Cardio: regular rate and rhythm GI: normal findings: bowel sounds normal and soft, non-tender Extremities: NVI   Assessment/Plan: Fall Concussion Multiple facial lacs -- Local care Bilateral mandible fxs s/p MMF Left proximal radius fx s/p ORIF Left wrist/MC fxs s/p ORIF -- NWB but can bear weight through elbow Multiple pelvic fxs -- Non-operative Open left femur/patella fxs s/p ORIF -- NWB, prophylactic abx ABL anemia -- Mild FEN -- Orals for pain VTE -- SCD's, Lovenox Dispo -- PT/OT, home w/[place] in [place]. Will try to set up local f/u with hand surgery, ortho, and ENT    [name] CaldronMichael J. Froylan Hobby, PA-C Pager: [phone] General Trauma PA Pager: [phone]  02/10/2015

## 2015-02-10 NOTE — Progress Notes (Signed)
02/10/2015 9:15 AM  [name], [name] [id number]  Post-Op Day 2    Temp:  [97.5 F (36.4 C)-97.8 F (36.6 C)] 97.8 F (36.6 C) (04/05 0509) Pulse Rate:  [106-126] 126 (04/05 0509) Resp:  [16-18] 16 (04/05 0509) BP: (116-126)/(54-76) 126/54 mmHg (04/05 0509) SpO2:  [93 %-100 %] 100 % (04/05 0509),     Intake/Output Summary (Last 24 hours) at 02/10/15 0915 Last data filed at 02/10/15 0530  Gross per 24 hour  Intake    720 ml  Output   1000 ml  Net   -280 ml    Panorex shows good alignment of LEFT body of mandible fx and stable fixation  SUBJECTIVE:  Min oral pain. No N,V  OBJECTIVE:  Wires secure  IMPRESSION:  Satisfactory check  PLAN:    Planning for discharge to return home to [place]  Will need eval with either Oral Surgeon or ENT in the next week or two, wires to be removed in 6 weeks.  Will need substantial dental assistance after he is out of fixation.  Flo ShanksWOLICKI, [name] [name]

## 2015-02-10 NOTE — Progress Notes (Addendum)
     Subjective:  POD#3 ORIF L proximal radius fracture, ORIF L distal femur fracture, and L closed reduction radial shaft. Patient reports pain as moderate.  Resting comfortably in bed.  Plan is to have ORIF of the L wrist back in [place].  Possible d/c home today if stable. Pelvic fractures reviewed by Dr. Carola FrostHandy and agrees that no further intervention in needed as long as the patient remains NWB in the LLE.  Objective:   VITALS:   Filed Vitals:   02/09/15 0509 02/09/15 1300 02/09/15 1948 02/10/15 0509  BP: 108/54 121/61 116/76 126/54  Pulse: 89 106 120 126  Temp: 97.3 F (36.3 C) 97.5 F (36.4 C) 97.7 F (36.5 C) 97.8 F (36.6 C)  TempSrc: Axillary     Resp: 16 18 16 16   Height:      Weight:      SpO2: 100% 100% 93% 100%    Neurologically intact ABD soft Neurovascular intact Sensation intact distally Intact pulses distally L arm and L leg splints intact  Lab Results  Component Value Date   WBC 13.1* 02/09/2015   HGB 9.6* 02/09/2015   HCT 27.6* 02/09/2015   MCV 87.3 02/09/2015   PLT 171 02/09/2015   BMET    Component Value Date/Time   NA 138 02/08/2015 0254   K 4.4 02/08/2015 0254   CL 104 02/08/2015 0254   CO2 23 02/08/2015 0254   GLUCOSE 135* 02/08/2015 0254   BUN 9 02/08/2015 0254   CREATININE 0.89 02/08/2015 0254   CALCIUM 8.8 02/08/2015 0254   GFRNONAA >90 02/08/2015 0254   GFRAA >90 02/08/2015 0254     Assessment/Plan: 3 Days Post-Op   Active Problems:   Fall   Concussion   Bilateral mandibular fracture   Face lacerations   Tooth fractures   Left wrist fracture   Fracture of metacarpal of left hand, closed   Multiple pelvic fractures   Open femur fracture, left   Left patella fracture   Acute blood loss anemia   Up with therapy NWB in the L wrist and LLE WBAT in the RLE and L elbow Continue DVT prophylaxis per trauma Stable from ortho standpoint, ok to discharge home once cleared by trauma.     Irving Bloor Hilda LiasMarie 02/10/2015, 7:26  AM Cell [phone]   Correction, the patient in NWB in the left wrist but is ok to WBAT in the left elbow.

## 2015-02-10 NOTE — Progress Notes (Addendum)
UR completed.  Pt was injured while working. He indicated to the CSW that his boss had started the Workers Comp process yesterday.  I went to patient today to see if he had any information so that arrangements could begin being made.  He was only able to tell me to call Theodoro GristDave at [phone].  I have called this number at 1335pm and left a voice mail requesting a return call. Pt wishes to have all of [place] medical [place].  He did not have specific requests re: physicians.   Carlyle LipaMichelle Javion Holmer, RN BSN MHA CCM Trauma/Neuro ICU Case Manager [phone]  *ADDENDUM:  WC Company is [employer]. Main phone number:  [phone] Claims Adjuster: Claudette ext. [phone] WC CM: Massie BougieBelinda Fax number: [phone] Claim #:  [id number] Pt's boss locally: Theodoro GristDave, [phone] Office contact for pt's boss: Angelique BlonderDenise [phone] [employer]:  The company that can provide network providers for pt's follow up.  [phone]  *I have contacted [employer] and spoke with Selena BattenKim (ext [phone]).  She confirmed that [facility] is in network for DME.  I have called in referral to them for the Left platform rolling walker and 3-in-1 bedside commode.  The patient will need a wheelchair but to avoid having the patient have to return the walker up to 4+ hours from his home, the MD will send patient with a Rx for a wheelchair so that it can be fulfilled locally. She has also given me Dentistnetwork surgeon names so that the medical team can arrange follow up in [place], [place] w/ orthopedic and oral/maxilofacial surgeons.   Carlyle LipaMichelle Landon Bassford, RN BSN MHA CCM  Case Manager, Trauma Service/Unit [id number] [phone]

## 2015-02-11 MED ORDER — HYDROCODONE-ACETAMINOPHEN 7.5-325 MG/15ML PO SOLN: 10.0000 mL | ORAL | Status: AC | PRN

## 2015-02-11 MED ORDER — PROMETHAZINE HCL 6.25 MG/5ML PO SYRP: 25.0000 mg | ORAL_SOLUTION | Freq: Four times a day (QID) | ORAL | Status: AC | PRN

## 2015-02-11 NOTE — Progress Notes (Signed)
Patient ID: Richey Doolittle, male   DOB: 1996/01/14, 19 y.o.   MRN: [id number]  LOS: 4 days   Subjective: Want to go home.  Awaiting callbacks from ENT and ortho  Objective: Vital signs in last 24 hours: Temp:  [97.5 F (36.4 C)-97.8 F (36.6 C)] 97.8 F (36.6 C) (04/06 0606) Pulse Rate:  [102-104] 102 (04/06 0606) Resp:  [16-20] 16 (04/06 0606) BP: (107-120)/(50-71) 115/66 mmHg (04/06 0606) SpO2:  [97 %-100 %] 97 % (04/06 0606) Last BM Date: 02/07/15  Lab Results:  CBC  Recent Labs  02/09/15 0745  WBC 13.1*  HGB 9.6*  HCT 27.6*  PLT 171   BMET No results for input(s): NA, K, CL, CO2, GLUCOSE, BUN, CREATININE, CALCIUM in the last 72 hours.  Imaging: Dg Orthopantogram  02/09/2015   CLINICAL DATA:  Followup mandible fracture.  EXAM: ORTHOPANTOGRAM/PANORAMIC  COMPARISON:  None.  FINDINGS: There are mandible screws and crossing wires transfixing the symphyseal region of the mandible with the maxilla. There is a nondisplaced fracture involving the parasymphyseal region of the left mandible.  IMPRESSION: Nondisplaced left parasymphyseal mandible fracture.  Mandible fixation with screws and cross wires.   Electronically Signed   By: Rudie Meyer M.D.   On: 02/09/2015 19:44   Ct Wrist Left Wo Contrast  02/10/2015   CLINICAL DATA:  Status post fall 02/07/2015 with a left wrist fracture. Preoperative examination. Initial encounter.  EXAM: CT OF THE LEFT WRIST WITHOUT CONTRAST  TECHNIQUE: Multidetector CT imaging was performed according to the standard protocol. Multiplanar CT image reconstructions were also generated.  COMPARISON:  Plain films left forearm 02/07/2015.  FINDINGS: The patient is in a plaster cast. As seen on the comparison examination, the patient has a highly comminuted, intra-articular fracture of the distal radius. There is a transverse component of the fracture through the metaphysis of the radius which demonstrates minimal posterior displacement and mild impaction of  approximately 0.5 cm. The fracture extends proximally into Lister's tubercle. Although the articular surface of the radius is markedly comminuted, 4 main fragments are identified. At the ulnar aspect of the articular surface, the two main fracture fragments are distracted 0.4 cm transverse by 0.5 cm AP. The radial styloid is divided into 2 fragments with distraction at the articular surface of 0.2 cm. Innumerable tiny bony fragments are identified at the articular surface.  Although not visualized on plain films, there is a minimally comminuted fracture through the junction of the mid and distal thirds of the triquetrum without notable displacement. Also seen is a nondisplaced fracture through the mid capitate. Small bony fragment is seen distal to the fovea of the ulna but no ulnar fracture is identified. Soft tissue swelling is seen about the wrist. No tendon entrapment is identified.  IMPRESSION: Highly comminuted intra-articular fracture of the distal radius as described above.  Nondisplaced fractures of the triquetrum and capitate.   Electronically Signed   By: Drusilla Kanner M.D.   On: 02/10/2015 13:08     Physical Exam General appearance: alert and no distress Resp: clear to auscultation bilaterally Cardio: regular rate and rhythm GI: normal findings: bowel sounds normal and soft, non-tender Extremities: NVI   Patient Active Problem List   Diagnosis Date Noted  . Concussion 02/08/2015  . Bilateral mandibular fracture 02/08/2015  . Face lacerations 02/08/2015  . Tooth fractures 02/08/2015  . Left wrist fracture 02/08/2015  . Fracture of metacarpal of left hand, closed 02/08/2015  . Multiple pelvic fractures 02/08/2015  . Open femur fracture,  left 02/08/2015  . Left patella fracture 02/08/2015  . Acute blood loss anemia 02/08/2015  . Fall 02/07/2015     Assessment/Plan: Fall Concussion Multiple facial lacs -- Local care Bilateral mandible fxs s/p MMF Left proximal radius fx  s/p ORIF Left wrist/MC fxs s/p ORIF -- NWB but can bear weight through elbow Multiple pelvic fxs -- Non-operative Open left femur/patella fxs s/p ORIF -- NWB ABL anemia -- Mild FEN -- Orals for pain VTE -- SCD's, Lovenox Dispo -- PT/OT, home w/HH in [place]. Will try to set up local f/u with hand surgery, ortho, and ENT.  DC today if able to get these follow ups arranged.  Has equipment at bedside, will take Rx for wheelchair since this is rented    WoodlakeEmina Ander Wamser, [place]-BC Pager: [phone] General Trauma PA Pager: [phone]   02/11/2015 11:51 AM

## 2015-02-11 NOTE — Progress Notes (Signed)
     Subjective:  POD#4 ORIF L proximal radius fracture, ORIF L distal femur fracture, and L closed reduction of radial shaft. Patient reports pain as mild to moderate.  Resting comfortably.  Case management has been able to arrange home health needs yesterday.  They are also working on finding follow up with ortho and [facility]/ENT in [place].  Objective:   VITALS:   Filed Vitals:   02/10/15 0509 02/10/15 1300 02/10/15 2104 02/11/15 0606  BP: 126/54 107/50 120/71 115/66  Pulse: 126 103 104 102  Temp: 97.8 F (36.6 C) 97.6 F (36.4 C) 97.5 F (36.4 C) 97.8 F (36.6 C)  TempSrc:      Resp: 16 20 18 16   Height:      Weight:      SpO2: 100% 100% 100% 97%    Neurologically intact ABD soft Neurovascular intact Sensation intact distally Intact pulses distally Intact left arm and left leg splints  Lab Results  Component Value Date   WBC 13.1* 02/09/2015   HGB 9.6* 02/09/2015   HCT 27.6* 02/09/2015   MCV 87.3 02/09/2015   PLT 171 02/09/2015   BMET    Component Value Date/Time   NA 138 02/08/2015 0254   K 4.4 02/08/2015 0254   CL 104 02/08/2015 0254   CO2 23 02/08/2015 0254   GLUCOSE 135* 02/08/2015 0254   BUN 9 02/08/2015 0254   CREATININE 0.89 02/08/2015 0254   CALCIUM 8.8 02/08/2015 0254   GFRNONAA >90 02/08/2015 0254   GFRAA >90 02/08/2015 0254     Assessment/Plan: 4 Days Post-Op   Active Problems:   Fall   Concussion   Bilateral mandibular fracture   Face lacerations   Tooth fractures   Left wrist fracture   Fracture of metacarpal of left hand, closed   Multiple pelvic fractures   Open femur fracture, left   Left patella fracture   Acute blood loss anemia   Up with therapy WBAT in the RLE and L elbow NWB in the LLE and L wrist Continue DVT prophylaxis per trauma Once case management has been able to arrange [place] and patient is stable, he will be discharged to home with follow-up in [place].  The patient also plans to have the left wrist surgery  back in [place].   [name] [name] [name] [name] 02/11/2015, 6:11 AM Cell [phone]

## 2015-02-11 NOTE — Progress Notes (Signed)
Physical Therapy Treatment Patient Details Name: [name] [name] MRN: [id number] DOB: [birth year] Today's Date: 02/11/2015    History of Present Illness 19 y.o. 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 He was knocked unconscious. So far injuries include open left femur fracture (s/p ORIF: NWBing), mandibular injuries, left radial head fracture left distal radius fracture left capitate fracture left triquetrum fracture (s/p ORIF: NWBing except through elbow), left LC 1/2 pelvis fracture.    PT Comments    Pt demonstrated ability to hop/step 6 ft this session, requiring verbal cues to slow down and ensure he is stabilized prior to taking the next step.  Pt is slightly impulsive.  Pt continues to progress toward PT goals.  Follow Up Recommendations  Supervision/Assistance - 24 hour     Equipment Recommendations  3in1 (PT);Wheelchair (measurements PT);Wheelchair cushion (measurements PT);Rolling walker with 5" wheels (L platform walker)    Recommendations for Other Services       Precautions / Restrictions Precautions Precautions: Fall Required Braces or Orthoses: Knee Immobilizer - Left;Other Brace/Splint Knee Immobilizer - Left: On at all times Other Brace/Splint: LUE bi-valve cast with ace wrap Restrictions Weight Bearing Restrictions: Yes LUE Weight Bearing: Weight bear through elbow only LLE Weight Bearing: Non weight bearing    Mobility  Bed Mobility Overal bed mobility:  (Pt found and left in recliner chair) Bed Mobility: Supine to Sit     Supine to sit: Mod assist;HOB elevated     General bed mobility comments: pt will have hospital bed for home d/c and able to use R LE to boost buttock and progress to eOB> OT helping support L LE and progress to EOB  Transfers Overall transfer level: Needs assistance Equipment used: Left platform walker Transfers: Sit to/from Stand Sit to Stand: Min assist;+2 physical assistance Stand pivot  transfers: +2 safety/equipment;Min assist       General transfer comment: Pt improved this session w/ sit<>stand.  Pt appears to remain fearful of WB on LLE and asks for assist elevating LLE during 1/2 sit>stand transfer.  Ambulation/Gait Ambulation/Gait assistance: Min assist;+2 safety/equipment Ambulation Distance (Feet): 6 Feet Assistive device:  (L platform RW) Gait Pattern/deviations: Step-to pattern;Antalgic;Decreased weight shift to left;Staggering left;Staggering right;Shuffle   Gait velocity interpretation: Below normal speed for age/gender General Gait Details: Pt required max verbal cues to take steps slowly and ensure stability prior to taking next step/hop.  Pt is impulsive and says, "I just want to get there".     Stairs            Wheelchair Mobility    Modified Rankin (Stroke Patients Only)       Balance Overall balance assessment: Needs assistance Sitting-balance support: Single extremity supported;Feet supported Sitting balance-Leahy Scale: Fair     Standing balance support: Bilateral upper extremity supported Standing balance-Leahy Scale: Poor                      Cognition Arousal/Alertness: Awake/alert Behavior During Therapy: WFL for tasks assessed/performed Overall Cognitive Status: Within Functional Limits for tasks assessed                      Exercises      General Comments General comments (skin integrity, edema, etc.): Pt w/ nausea after amb 3 ft to sitting EOB which dissipated after 5 minutes.        Pertinent Vitals/Pain Pain Assessment: 0-10 Pain Score: 5  Pain Location: LUE/LLE  Pain Descriptors / Indicators: Aching;Constant;Dull;Grimacing;Guarding Pain Intervention(s): Limited activity within patient's tolerance;Monitored during session;Repositioned;Relaxation    Home Living                      Prior Function            PT Goals (current goals can now be found in the care plan section) Acute  Rehab PT Goals Patient Stated Goal: none stated PT Goal Formulation: With patient Time For Goal Achievement: 02/22/15 Potential to Achieve Goals: Good Progress towards PT goals: Progressing toward goals    Frequency  Min 5X/week    PT Plan Current plan remains appropriate;Frequency needs to be updated    Co-evaluation             End of Session Equipment Utilized During Treatment: Left knee immobilizer;Other (comment);Gait belt (L arm bivalve cast) Activity Tolerance: Patient limited by fatigue;Patient tolerated treatment well;Patient limited by pain Patient left: in chair;with call bell/phone within reach     Time: 0926-0943 PT Time Calculation (min) (ACUTE ONLY): 17 min  Charges:  $Gait Training: 8-22 mins                    G CodesMichail Jewels:       Ashley Parr PT, [place]PT [phone]  [phone] 02/11/2015, 11:47 AM

## 2015-02-11 NOTE — Progress Notes (Addendum)
Occupational Therapy Treatment Patient Details Name: [name] [name] MRN: [id number] DOB: 06-Oct-1996 Today's Date: 02/11/2015    History of present illness 19 y.o. 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 He was knocked unconscious. So far injuries include open left femur fracture (s/p ORIF: NWBing), mandibular injuries, left radial head fracture left distal radius fracture left capitate fracture left triquetrum fracture (s/p ORIF: NWBing except through elbow), left LC 1/2 pelvis fracture.   OT comments  Pt progressing with therapy this session and demonstrates 3n1 transfer with +2 (A) due to safety. Pt with difficulty powering up into standing from standard height surfaces. Pt needs to use rocking motion and +1 (A) to achieve. Pt with +2 (A) due to safety and guarding L LE to prevent weight bearing. Pt with improved ability to keep elevated off floor.   Also of note: Pt with a stick that he has been using to "scratch" under cast and back independently. Pt advised to no longer use this item due to risk of further injury and infection. Pt has been sticking this down under KI. Pt educated on having staff or family use cool wash cloth to wipe down skin to prevent the need to "scratch". Pt voiced understanding. Object removed and placed on shelf in room.     Follow Up Recommendations  Home health OT    Equipment Recommendations  3 in 1 bedside comode    Recommendations for Other Services      Precautions / Restrictions Precautions Precautions: Fall Required Braces or Orthoses: Knee Immobilizer - Left;Other Brace/Splint Knee Immobilizer - Left: On at all times Other Brace/Splint: LUE bi-valve cast with ace wrap Restrictions Weight Bearing Restrictions: Yes LUE Weight Bearing: Weight bear through elbow only LLE Weight Bearing: Non weight bearing       Mobility Bed Mobility Overal bed mobility: Needs Assistance;+ 2 for safety/equipment Bed Mobility:  Supine to Sit     Supine to sit: Mod assist;HOB elevated     General bed mobility comments: pt will have hospital bed for home d/c and able to use R LE to boost buttock and progress to eOB> OT helping support L LE and progress to EOB  Transfers Overall transfer level: Needs assistance Equipment used: Left platform walker Transfers: Sit to/from Stand Sit to Stand: +2 physical assistance;Mod assist Stand pivot transfers: +2 safety/equipment;Min assist       General transfer comment: Pt able to hop x4 with RW this session and pivoting to 3n1. pt requires (A) to power up from seated position. pt transfering this session from standard height surfaces    Balance Overall balance assessment: Needs assistance Sitting-balance support: Single extremity supported;Feet supported Sitting balance-Leahy Scale: Fair     Standing balance support: Bilateral upper extremity supported;During functional activity Standing balance-Leahy Scale: Poor                     ADL Overall ADL's : Needs assistance/impaired Eating/Feeding: Set up;Sitting   Grooming: Wash/dry face;Set up;Sitting                   Toilet Transfer: Minimal assistance;Stand-pivot;BSC;RW Toilet Transfer Details (indicate cue type and reason): Pt educated on toilet transfer and positioning. pt pushing off with R UE and R LE. 3n1 sliding with motion. Pt educated that 3n1 must be against a wall or soild surface to prevent sliding for safety. RN and tech advised +2 transfers but able to complete transfer for voids  now.  Toileting- Clothing Manipulation and Hygiene: Minimal assistance;Sit to/from stand       Functional mobility during ADLs: +2 for safety/equipment;Rolling walker General ADL Comments: Pt extremely motivated and progressing well. pt eager to d/c home. Pt with excellent positioning of L UE with transfers. Pt educated further on edema management with elevation in chair       Vision                      Perception     Praxis      Cognition   Behavior During Therapy: WFL for tasks assessed/performed Overall Cognitive Status: Within Functional Limits for tasks assessed                       Extremity/Trunk Assessment               Exercises     Shoulder Instructions       General Comments      Pertinent Vitals/ Pain       Pain Assessment: 0-10 Pain Score: 2  Pain Location: just general soreness Pain Descriptors / Indicators: Dull Pain Intervention(s): Monitored during session;Premedicated before session  Home Living                                          Prior Functioning/Environment              Frequency Min 2X/week     Progress Toward Goals  OT Goals(current goals can now be found in the care plan section)  Progress towards OT goals: Progressing toward goals  Acute Rehab OT Goals Patient Stated Goal: none stated OT Goal Formulation: With patient Time For Goal Achievement: 02/15/15 Potential to Achieve Goals: Good ADL Goals Pt Will Perform Lower Body Bathing: with caregiver independent in assisting Pt Will Perform Lower Body Dressing: with caregiver independent in assisting;with adaptive equipment;sit to/from stand Pt Will Transfer to Toilet: with min assist;stand pivot transfer;bedside commode Pt Will Perform Toileting - Clothing Manipulation and hygiene: with min assist;with caregiver independent in assisting;sit to/from stand Additional ADL Goal #1: Pt will be min A for in and OOB for BADLs (or up/down from recliner--may sleep in this at home)  Plan Discharge plan remains appropriate    Co-evaluation                 End of Session Equipment Utilized During Treatment: Rolling walker   Activity Tolerance Patient tolerated treatment well   Patient Left in chair;with call bell/phone within reach;with family/visitor present   Nurse Communication Mobility status;Precautions        Time:  [id number] OT Time Calculation (min): 17 min  Charges: OT General Charges $OT Visit: 1 Procedure OT Treatments $Self Care/Home Management : 8-22 mins  Boone MasterJones, Keeleigh Terris B 02/11/2015, 10:16 AM  Pager: [phone]

## 2015-02-12 ENCOUNTER — Telehealth (HOSPITAL_COMMUNITY): Payer: Self-pay

## 2015-02-12 ENCOUNTER — Encounter (HOSPITAL_COMMUNITY): Payer: Self-pay | Admitting: Orthopedic Surgery

## 2015-02-12 NOTE — Telephone Encounter (Signed)
[name] [name] took care of it.

## 2015-02-12 NOTE — Progress Notes (Signed)
Patient ID: [name] [name], male   DOB: Jul 28, 1996, 19 y.o.   MRN: [id number]  LOS: 5 days   Subjective: No issues.  VSS.  Afebrile.  Psssing flatus.  Voiding.    Objective: Vital signs in last 24 hours: Temp:  [97.5 F (36.4 C)-97.6 F (36.4 C)] 97.5 F (36.4 C) (04/07 0652) Pulse Rate:  [101-104] 104 (04/07 0652) Resp:  [18] 18 (04/06 1500) BP: (113-120)/(60-70) 120/70 mmHg (04/07 0652) SpO2:  [93 %-100 %] 100 % (04/07 0652) Last BM Date: 02/07/15  Lab Results:  CBC No results for input(s): WBC, HGB, HCT, PLT in the last 72 hours. BMET No results for input(s): NA, K, CL, CO2, GLUCOSE, BUN, CREATININE, CALCIUM in the last 72 hours.  Imaging: Ct Wrist Left Wo Contrast  02/10/2015   CLINICAL DATA:  Status post fall 02/07/2015 with a left wrist fracture. Preoperative examination. Initial encounter.  EXAM: CT OF THE LEFT WRIST WITHOUT CONTRAST  TECHNIQUE: Multidetector CT imaging was performed according to the standard protocol. Multiplanar CT image reconstructions were also generated.  COMPARISON:  Plain films left forearm 02/07/2015.  FINDINGS: The patient is in a plaster cast. As seen on the comparison examination, the patient has a highly comminuted, intra-articular fracture of the distal radius. There is a transverse component of the fracture through the metaphysis of the radius which demonstrates minimal posterior displacement and mild impaction of approximately 0.5 cm. The fracture extends proximally into Lister's tubercle. Although the articular surface of the radius is markedly comminuted, 4 main fragments are identified. At the ulnar aspect of the articular surface, the two main fracture fragments are distracted 0.4 cm transverse by 0.5 cm AP. The radial styloid is divided into 2 fragments with distraction at the articular surface of 0.2 cm. Innumerable tiny bony fragments are identified at the articular surface.  Although not visualized on plain films, there is a minimally comminuted  fracture through the junction of the mid and distal thirds of the triquetrum without notable displacement. Also seen is a nondisplaced fracture through the mid capitate. Small bony fragment is seen distal to the fovea of the ulna but no ulnar fracture is identified. Soft tissue swelling is seen about the wrist. No tendon entrapment is identified.  IMPRESSION: Highly comminuted intra-articular fracture of the distal radius as described above.  Nondisplaced fractures of the triquetrum and capitate.   Electronically Signed   By: Drusilla Kanner M.D.   On: 02/10/2015 13:08    Physical Exam General appearance: alert and no distress Resp: clear to auscultation bilaterally Cardio: regular rate and rhythm.  3/6 SEM.   GI: normal findings: bowel sounds normal and soft, non-tender Extremities: NVI   Patient Active Problem List   Diagnosis Date Noted  . Concussion 02/08/2015  . Bilateral mandibular fracture 02/08/2015  . Face lacerations 02/08/2015  . Tooth fractures 02/08/2015  . Left wrist fracture 02/08/2015  . Fracture of metacarpal of left hand, closed 02/08/2015  . Multiple pelvic fractures 02/08/2015  . Open femur fracture, left 02/08/2015  . Left patella fracture 02/08/2015  . Acute blood loss anemia 02/08/2015  . Fall 02/07/2015     Assessment/Plan: Fall Concussion Multiple facial lacs -- Local care Bilateral mandible fxs s/p MMF Left proximal radius fx s/p ORIF Left wrist/MC fxs s/p ORIF -- NWB but can bear weight through elbow Multiple pelvic fxs -- Non-operative Open left femur/patella fxs s/p ORIF -- NWB ABL anemia -- Mild FEN -- Orals for pain VTE -- SCD's, Lovenox Dispo -- follow  up arranged.  Awaiting approval for pain meds, once approved, will get Rx filled and DC patient today. Has equipment at bedside, will take Rx for wheelchair since this is rented     NavarinoEmina Bianna Haran, [place] Pager: [phone] General Trauma PA Pager: [phone]   02/12/2015  8:29 AM

## 2015-02-12 NOTE — Progress Notes (Addendum)
WC Company is [employer]. Main phone number:  [phone] Claims Adjuster: Claudette ext. [phone] WC CM: Massie BougieBelinda Fax number: [phone] Claim #:  [id number] Pt's boss locally: Theodoro GristDave, [phone] Office contact for pt's boss: Angelique BlonderDenise [phone] [employer]:  The company that can provide network providers for pt's follow up.  [phone]   The patient has been assigned to his WC CM that will follow the case until its conclusion.  She is Doreene BurkeBeth Marshall and can be reached at the [facility] number above, ext [phone].  The permanently assigned Claims Adjuster is Georgiann MohsHeather Haynes and her extension is [phone].  I have spoken with both in the last 12-24hours.  The pt's discharge prescriptions are in process of being filled at our hospital outpatient pharmacy and should be available by noon today.  I faxed the requested information along with completed referral forms to both the Orthopedic Trauma Surgery office and the Facial Surgery Specialist office yesterday.  I called to confirm receipt and appointment times. The Orthopedic Office said the MD is out and will not be back until 02/18/2015 at which time he will review referral to determine if he will assume the patient's care. I am waiting for a call from either Beth or Heather at this time to determine next steps as the patient MUST have orthopedic follow up.  I have also contacted the facial who confirmed that they would see the patient next week. *Beth Marshall, [facility] follow up.  I have spoken with Cassie there in the orthopedic office and she confirmed pt's appointment for follow up with trauma surgeon on 02/19/2015 at 0930.  Carlyle LipaMichelle Kahleel Fadeley, RN BSN MHA CCM  Case Manager, Trauma Service/Unit [facility] [phone]  **ADDENDUM 02/12/2015 1500pm:  WC CM was unsure if she would find any local provider [place].  I have faxed the HHPT/OT orders to her  to be able to arrange if she finds a provider. Pt was discharged without this arranged as WC is completing the process.  Pt[facility] and delivered by me to the patient's room and given to pt's mother.  Pt and mother also provided with disk of all imaging performed here and a copy of the patient's chart with instructions to take all of it with him to his first follow up appointments. Pt also has the original referral form for his facial surgeon.  There was no referral for the [facility]opedic [facility] at [facility] as they took the info over the phone.  Carlyle LipaMichelle Aireona Torelli, RN BSN MHA CCM  Case Manager, Trauma Service/Unit 72[facility] [phone]

## 2015-02-12 NOTE — Discharge Summary (Signed)
Physician Discharge Summary  [name] SicilianFrank [name] WUJ:[id number] DOB: Aug 09, 1996 DOA: 02/07/2015  PCP: No primary care provider on file.  Consultation: ENT---Dr. Flo ShanksKarol Wolicki   Orthopedics--Dr. Betha LoaKevin Kuzma and Dr. Margarita Ranaimothy Murphy   Admit date: 02/07/2015 Discharge date: 02/12/2015  Recommendations for Outpatient Follow-up:   Follow-up Information    Follow up with [facility].   Why:  As needed   Contact information:   [address] [place] [id number] [phone]      Follow up with dr. Fransisca Connorsbramer On 02/19/2015.   Why:  telephone [phone] appt time at 930am      Follow up with dr. wise.   Why:  you need to be seen next week.   Contact information:   tel [phone]     Discharge Diagnoses:  1. Fall 2. Concussion 3. Multiple facial lacerations 4. Bilateral mandible fractures 5. Left proximal radius fracture 6. Left wrist fracture 7. Multiple pelvic fractures 8. Open left femur fracture 9. Patella fracture 10. Abl anemia   Surgical Procedure:  Dr. Effie ShyKarol Wolicki---Mandibulo-maxillary fixation , closure lacerations  Dr. Marcial Pacasimothy Murphy---OPEN REDUCTION INTERNAL FIXATION (ORIF) PROXIMAL RADIAL FRACTURE, OPEN REDUCTION INTERNAL FIXATION (ORIF) DISTAL FEMUR FRACTURE, CLOSED REDUCTION RADIAL SHAFT, Wrist   Discharge Condition: stable Disposition: home  Diet recommendation: full liquid  Filed Weights   02/07/15 1524  Weight: 115.667 kg (255 lb)    Filed Vitals:   02/12/15 1314  BP: 119/52  Pulse:   Temp: 97.1 F (36.2 C)  Resp: 18      Hospital Course:  [name] SicilianFrank [name] is a 19 year old male with a history of valve replacement who was working on a roof when he fell through the ceiling about 30 feet.  He was amnesic to the event.  The patient was found to have multiple injuries including; a closed head injury, forehead laceration bilateral mandibular fractures, multiple fractured teeth, L chin laceration, open comminuted L  distal femur, L patella fx, L distalm radius fx, L sacral ala fx into SI joint, Pelvic hematoma and L sup/inf pubic rami fracture.  He was given a Tdap and ancef in the ED and transferred to the OR after ENT and orthopedics consultations.  He tolerated the procedure well.  He was deemed NWB BLE and NWB left wrist, but can bear weight through the elbow.  The patient remained neuroligacally intact.  Vital signs remained stable.  Mild ABL anemia and did not require blood transfusions.  Diet was advanced to full liquid.  [facility] with [facility]. The patient wished to [place].  We therefore found ENT and orthopedic trauma surgeons to assume his care.  He understands that he needs to see ENT in about 1 week and will need extensive dental assistance once the fixation which ar to be removed in 6 weeks.  He understands he needs to follow with orthopedics in the next 1-2 weeks.  His restrictions now are as follows; WBAT in RLE and L elbow, NWB in the LLE and L wrist.  He will need to have wrist surgery at an interval time.  On HD#5 the patient was tolerating a FL diet, participating in therapies, pain well controlled, VSS and therefore felt stable for discharge home.  Medication risks, benefits and therapeutic alternatives were reviewed with the patient.  He verbalizes understanding. He was given a 3:1 BSC, rolling walker and a Rx for a wheelchair.  He  was provided with pain medication.  The patient was encouraged to call with questions or concerns.   The patient was provided with all imaging on disks to provide to his surgeons assuming his care.  Discharge Instructions     Medication List    TAKE these medications        aspirin EC 325 MG tablet  Take 325 mg by mouth daily.     HYDROcodone-acetaminophen 7.5-325 mg/15 ml solution  Commonly known as:  HYCET  Take 10-20 mLs by mouth every 4 (four) hours as needed (Pain).      promethazine 6.25 MG/5ML syrup  Commonly known as:  PHENERGAN  Take 20 mLs (25 mg total) by mouth every 6 (six) hours as needed for nausea or vomiting.       Follow-up Information    Follow up with [facility].   Why:  As needed   Contact information:   [address] [place] [address] [phone]      Follow up with dr. Fransisca Connors On 02/19/2015.   Why:  telephone [phone] appt time at 930am      Follow up with dr. wise.   Why:  you need to be seen next week.   Contact information:   tel [phone]       The results of significant diagnostics from this hospitalization (including imaging, microbiology, ancillary and laboratory) are listed below for reference.    Significant Diagnostic Studies: Dg Orthopantogram  02/09/2015   CLINICAL DATA:  Followup mandible fracture.  EXAM: ORTHOPANTOGRAM/PANORAMIC  COMPARISON:  None.  FINDINGS: There are mandible screws and crossing wires transfixing the symphyseal region of the mandible with the maxilla. There is a nondisplaced fracture involving the parasymphyseal region of the left mandible.  IMPRESSION: Nondisplaced left parasymphyseal mandible fracture.  Mandible fixation with screws and cross wires.   Electronically Signed   By: Rudie Meyer M.D.   On: 02/09/2015 19:44   Dg Forearm Left  02/08/2015   CLINICAL DATA:  Postoperative radiograph, status post internal fixation of radial head fracture. Initial encounter.  EXAM: LEFT FOREARM - 2 VIEW  COMPARISON:  Left forearm radiographs performed earlier today at 4:05 p.m.  FINDINGS: A plate and screws are noted along the proximal radius, transfixing the radial head fracture in near anatomic alignment. There is minimal residual displacement of radial head fragments.  There is significantly improved alignment of the distal radius fracture, with residual dorsal displacement of a dorsal fragment. The carpal rows appear grossly intact, and demonstrate normal  alignment. The soft tissues are not well assessed due to the overlying cast.  IMPRESSION: 1. Plate and screws along the proximal radius, transfixing the radial head fracture in near anatomic alignment. Minimal residual displacement of radial head fragments. 2. Significantly improved alignment of the distal radius fracture, with residual dorsal displacement of the dorsal fragment.   Electronically Signed   By: Roanna Raider M.D.   On: 02/08/2015 02:55   Dg Forearm Left  02/07/2015   CLINICAL DATA:  Fall through roof from 30 foot height. Left forearm pain and physical deformity. Initial encounter.  EXAM: LEFT FOREARM - 2 VIEW  COMPARISON:  None.  FINDINGS: A comminuted fracture of the distal radius is seen, with involvement of the radiocarpal and distal radial ulnar joints. There is posterior displacement and angulation of the distal radial fracture fragments.  In addition, there is a comminuted fracture of the radial head. No ulnar fracture identified.  IMPRESSION: Comminuted  distal radius fracture with dorsal displacement and angulation.  Comminuted fracture of the radial head. Dedicated elbow radiographs recommended for further evaluation.   Electronically Signed   By: Myles Rosenthal M.D.   On: 02/07/2015 17:35   Dg Wrist Complete Left  02/07/2015   CLINICAL DATA:  Larey Seat while working on roof.  EXAM: LEFT WRIST - COMPLETE 3+ VIEW  COMPARISON:  None.  FINDINGS: There is a comminuted intra-articular fracture of the distal radius with marked dorsal displacement, impaction and dorsal angulation. There also is a fracture across the midportion of the triquetrum, minimally displaced. There is a transverse nondisplaced fracture across the capitate. At the radial aspect of the wrist, the scaphoid, trapezoid and trapezium are not well seen on this study due to positioning limitations and rotation.  IMPRESSION: 1. Comminuted intra-articular fracture of the distal radius with marked impaction, displacement and angulation. 2.  Transverse fractures across the triquetrum and capitate 3. Limited visibility of the bones at the radial aspect of the wrist.   Electronically Signed   By: Ellery Plunk M.D.   On: 02/07/2015 17:38   Ct Head Wo Contrast  02/07/2015   CLINICAL DATA:  Larey Seat from 30 feet. Landed face first. Jaw pain. Initial encounter.  EXAM: CT HEAD WITHOUT CONTRAST  CT MAXILLOFACIAL WITHOUT CONTRAST  CT CERVICAL SPINE WITHOUT CONTRAST  TECHNIQUE: Multidetector CT imaging of the head, cervical spine, and maxillofacial structures were performed using the standard protocol without intravenous contrast. Multiplanar CT image reconstructions of the cervical spine and maxillofacial structures were also generated.  COMPARISON:  Chest and pelvic films of same date.  FINDINGS: CT HEAD FINDINGS  Sinuses/Soft tissues: Soft tissue swelling and probable laceration about the left frontal scalp. Clear paranasal sinuses and mastoid air cells. No skull fracture. Clear mastoid air cells.  Intracranial: No mass lesion, hemorrhage, hydrocephalus, acute infarct, intra-axial, or extra-axial fluid collection.  CT MAXILLOFACIAL FINDINGS  Soft tissues: Soft tissue swelling and gas about the left side of the mandible. Normal appearance of the orbits and globes, without retrobulbar hemorrhage.  Bones: Comminuted fracture of the body of the left side of the mandible. Minimally displaced. Adjacent tooth fractures.  Minimally displaced, minimally comminuted fracture of the right mandibular condyle. Example image 38 of series 3. The condyle may be minimally subluxed anteriorly.  Fractures of multiple right-sided mandibular teeth. Teeth 29 through 31. At least 1 right maxillary to fracture, including on image 32 of series 3. Tooth 5.  Zygomatic arches are intact. No fluid in the paranasal sinuses or mastoid air cells. Pterygoid plates intact. Mucosal thickening of the right maxillary sinus.  Coronal reformats demonstrate intact orbital floors.  CT CERVICAL  SPINE FINDINGS  Spinal visualization through the bottom of C5 on the initial exam. From the mid C6 level inferiorly are not well evaluated secondary to motion. Prevertebral soft tissues are within normal limits. No apical pneumothorax.  Skull base intact. Maintenance of vertebral body height through the C5 level. Facets are well-aligned. Coronal reformats demonstrate a normal C1-C2 articulation.  Subsequent repeat imaging includes from C6 through the bottom of T1. No prevertebral soft tissue swelling across these levels. No fracture or subluxation identified.  IMPRESSION: 1. Left frontal scalp soft tissue swelling, without acute intracranial abnormality. 2. Left mandibular body comminuted fracture. Right mandibular condyle fracture with suggestion of subluxation. Bilateral fractured teeth. 3. No acute findings within the cervical spine.   Electronically Signed   By: Jeronimo Greaves M.D.   On: 02/07/2015 17:07   Ct  Chest W Contrast  02/07/2015   CLINICAL DATA:  Level 1 trauma, patient fell 30 feet through the roof of a warehouse and landed face first onto concrete. Facial pain, left wrist pain, left knee pain. History of aortic valve stenosis and mitral valve stenosis and prior cardiac surgery.  EXAM: CT CHEST, ABDOMEN AND PELVIS WITHOUT CONTRAST  TECHNIQUE: Multidetector CT imaging of the chest, abdomen and pelvis was performed following the standard protocol without IV contrast.  COMPARISON:  Radiographs from 02/07/2015  FINDINGS: The patient was scanned with his arms by his sides, introducing streak artifact which can reduce sensitivity and specificity in assessing the upper abdominal organs.  CT CHEST FINDINGS  Mediastinum/Nodes: Dilated ascending aorta at 4 cm. Postoperative findings along the aortic valve. Anterior mediastinal density is probably either postoperative or due to thymus. I am less suspicious of this being acute hemorrhage based on the morphology and appearance.  No pathologic thoracic adenopathy. I  do not observe an aortic dissection  Lungs/Pleura: No pneumothorax or pneumomediastinum. Subsegmental atelectasis posteriorly in the left lower lobe.  Musculoskeletal: No thoracic fracture identified. Multilevel Schmorl's nodes without kyphosis.  CT ABDOMEN PELVIS FINDINGS  Hepatobiliary: Unremarkable  Pancreas: Unremarkable  Spleen: Unremarkable  Adrenals/Urinary Tract: Unremarkable  Stomach/Bowel: Unremarkable  Vascular/Lymphatic: No vascular dissection or active bleeding is identified.  Reproductive: Tissue planes around the left side of the prostate gland are somewhat obscured due to hematoma.  Other: No ascites identified.  Musculoskeletal: Acute fracture, left sacrum, primarily involving the anterior portion of the left sacral ala and extending into the anterior portion of the left sacroiliac joint with some mild widening of the anterior portion of the left SI joint. The adjacent hematoma in the left presacral space with stranding tracking around the superior gluteal artery but without direct extravasation observed, and with hematoma tracking primarily at along the left piriformis muscle but also mildly expanding the left obturator internus. There stranding around the left sciatic nerve as it exits through the sciatic foramen.  As shown on conventional radiography, there are also left pubic rami fractures. These consist of a medial superior ramus fracture extending into the pubic body and upper margin of the pubic symphysis, as well as a mildly comminuted inferior pubic ramus fracture in the midportion of the inferior ramus. Indistinctness of adjacent tissue planes along the medial margins of the left hip adductor musculature noted. Again, no active bleeding is identified. No extension into the left acetabulum is observed. There is a small amount of hemorrhage in the left anterior states of right CS. The regional hematoma does not appear to be causing a great deal of impingement at the operator foramen.   IMPRESSION: 1. Acute pelvic fractures include the anterior upper left sacrum (extending into the left SI joint) ; the midportion of the inferior pubic ramus ; and the medial portion of the superior pubic ramus extending into the left pubic body. There is adjacent hematoma tracking in the vicinity of these fractures and a small amount of hematoma in the left anterior space of right CS, but we do not demonstrate active extravasation of contrast to suggest active bleeding. Although there is hematoma tracking around the left operator internus and left piriformis muscles, the degree of impingement at the operator foramen and sciatic notch appears currently minimal. 2. 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 I suspect this is a chronic finding in this patient. There is some adjacent anterior mediastinal density which is probably postoperative in considered less  likely to be due to active mediastinal hematoma given the small and bandlike orientation. I do not observe a dissection.   Electronically Signed   By: Gaylyn Rong M.D.   On: 02/07/2015 16:59   Ct Cervical Spine Wo Contrast  02/07/2015   CLINICAL DATA:  Larey Seat from 30 feet. Landed face first. Jaw pain. Initial encounter.  EXAM: CT HEAD WITHOUT CONTRAST  CT MAXILLOFACIAL WITHOUT CONTRAST  CT CERVICAL SPINE WITHOUT CONTRAST  TECHNIQUE: Multidetector CT imaging of the head, cervical spine, and maxillofacial structures were performed using the standard protocol without intravenous contrast. Multiplanar CT image reconstructions of the cervical spine and maxillofacial structures were also generated.  COMPARISON:  Chest and pelvic films of same date.  FINDINGS: CT HEAD FINDINGS  Sinuses/Soft tissues: Soft tissue swelling and probable laceration about the left frontal scalp. Clear paranasal sinuses and mastoid air cells. No skull fracture. Clear mastoid air cells.  Intracranial: No mass lesion, hemorrhage, hydrocephalus, acute  infarct, intra-axial, or extra-axial fluid collection.  CT MAXILLOFACIAL FINDINGS  Soft tissues: Soft tissue swelling and gas about the left side of the mandible. Normal appearance of the orbits and globes, without retrobulbar hemorrhage.  Bones: Comminuted fracture of the body of the left side of the mandible. Minimally displaced. Adjacent tooth fractures.  Minimally displaced, minimally comminuted fracture of the right mandibular condyle. Example image 38 of series 3. The condyle may be minimally subluxed anteriorly.  Fractures of multiple right-sided mandibular teeth. Teeth 29 through 31. At least 1 right maxillary to fracture, including on image 32 of series 3. Tooth 5.  Zygomatic arches are intact. No fluid in the paranasal sinuses or mastoid air cells. Pterygoid plates intact. Mucosal thickening of the right maxillary sinus.  Coronal reformats demonstrate intact orbital floors.  CT CERVICAL SPINE FINDINGS  Spinal visualization through the bottom of C5 on the initial exam. From the mid C6 level inferiorly are not well evaluated secondary to motion. Prevertebral soft tissues are within normal limits. No apical pneumothorax.  Skull base intact. Maintenance of vertebral body height through the C5 level. Facets are well-aligned. Coronal reformats demonstrate a normal C1-C2 articulation.  Subsequent repeat imaging includes from C6 through the bottom of T1. No prevertebral soft tissue swelling across these levels. No fracture or subluxation identified.  IMPRESSION: 1. Left frontal scalp soft tissue swelling, without acute intracranial abnormality. 2. Left mandibular body comminuted fracture. Right mandibular condyle fracture with suggestion of subluxation. Bilateral fractured teeth. 3. No acute findings within the cervical spine.   Electronically Signed   By: Jeronimo Greaves M.D.   On: 02/07/2015 17:07   Ct Knee Left Wo Contrast  02/07/2015   CLINICAL DATA:  Level 1 trauma. Pull-through where house roof. Evaluate  distal femur fracture. Initial encounter.  EXAM: CT OF THE LEFT KNEE WITHOUT CONTRAST  TECHNIQUE: Multidetector CT imaging of the left knee was performed according to the standard protocol. Multiplanar CT image reconstructions were also generated.  COMPARISON:  None.  FINDINGS: There is a mildly comminuted and significantly displaced fracture of the distal femoral diaphysis. This fracture demonstrates more than 1 shaft width of posterior displacement and mild overriding of the fracture fragments. There is a nondisplaced oblique fracture of the distal femur demonstrating intercondylar extension. This fracture exits from the medial metaphyseal cortex. The fracture appears open anteriorly.  Fragmentation of the patella laterally has sharp margins and is suspicious for a nondisplaced fracture rather than a bipartite patella.  The proximal fibula and proximal tibia are intact.  Large left knee lipohemarthrosis is noted. Intra-articular air is also present. There is multifocal soft tissue emphysema within the central left thigh musculature, proximal extent incompletely visualized. The distal femoral and popliteal arteries appear grossly intact on non contrast imaging. The quadriceps tendon is intact, although is posteriorly buckled at the level of the distal femoral diaphyseal fracture. There is some laxity of the patellar tendon. The cruciate and collateral ligaments appear intact, and there is no dislocation at the knee.  IMPRESSION: 1. Comminuted open fracture of the distal femur with significant posterior displacement as described. There is intra-articular extension of the fracture into the intercondylar region. 2. Suspected nondisplaced fracture of the patella laterally. 3. The open femoral fracture is associated with intra-articular air as well as soft tissue emphysema in the distal thigh. Patient is at risk for intra-articular infection. 4. Possible injury of the quadriceps tendon near the musculotendinous junction  without demonstrated complete rupture.   Electronically Signed   By: Carey Bullocks M.D.   On: 02/07/2015 16:59   Ct Abdomen Pelvis W Contrast  02/07/2015   CLINICAL DATA:  Level 1 trauma, patient fell 30 feet through the roof of a warehouse and landed face first onto concrete. Facial pain, left wrist pain, left knee pain. History of aortic valve stenosis and mitral valve stenosis and prior cardiac surgery.  EXAM: CT CHEST, ABDOMEN AND PELVIS WITHOUT CONTRAST  TECHNIQUE: Multidetector CT imaging of the chest, abdomen and pelvis was performed following the standard protocol without IV contrast.  COMPARISON:  Radiographs from 02/07/2015  FINDINGS: The patient was scanned with his arms by his sides, introducing streak artifact which can reduce sensitivity and specificity in assessing the upper abdominal organs.  CT CHEST FINDINGS  Mediastinum/Nodes: Dilated ascending aorta at 4 cm. Postoperative findings along the aortic valve. Anterior mediastinal density is probably either postoperative or due to thymus. I am less suspicious of this being acute hemorrhage based on the morphology and appearance.  No pathologic thoracic adenopathy. I do not observe an aortic dissection  Lungs/Pleura: No pneumothorax or pneumomediastinum. Subsegmental atelectasis posteriorly in the left lower lobe.  Musculoskeletal: No thoracic fracture identified. Multilevel Schmorl's nodes without kyphosis.  CT ABDOMEN PELVIS FINDINGS  Hepatobiliary: Unremarkable  Pancreas: Unremarkable  Spleen: Unremarkable  Adrenals/Urinary Tract: Unremarkable  Stomach/Bowel: Unremarkable  Vascular/Lymphatic: No vascular dissection or active bleeding is identified.  Reproductive: Tissue planes around the left side of the prostate gland are somewhat obscured due to hematoma.  Other: No ascites identified.  Musculoskeletal: Acute fracture, left sacrum, primarily involving the anterior portion of the left sacral ala and extending into the anterior portion of the  left sacroiliac joint with some mild widening of the anterior portion of the left SI joint. The adjacent hematoma in the left presacral space with stranding tracking around the superior gluteal artery but without direct extravasation observed, and with hematoma tracking primarily at along the left piriformis muscle but also mildly expanding the left obturator internus. There stranding around the left sciatic nerve as it exits through the sciatic foramen.  As shown on conventional radiography, there are also left pubic rami fractures. These consist of a medial superior ramus fracture extending into the pubic body and upper margin of the pubic symphysis, as well as a mildly comminuted inferior pubic ramus fracture in the midportion of the inferior ramus. Indistinctness of adjacent tissue planes along the medial margins of the left hip adductor musculature noted. Again, no active bleeding is identified. No extension into the left acetabulum is  observed. There is a small amount of hemorrhage in the left anterior states of right CS. The regional hematoma does not appear to be causing a great deal of impingement at the operator foramen.  IMPRESSION: 1. Acute pelvic fractures include the anterior upper left sacrum (extending into the left SI joint) ; the midportion of the inferior pubic ramus ; and the medial portion of the superior pubic ramus extending into the left pubic body. There is adjacent hematoma tracking in the vicinity of these fractures and a small amount of hematoma in the left anterior space of right CS, but we do not demonstrate active extravasation of contrast to suggest active bleeding. Although there is hematoma tracking around the left operator internus and left piriformis muscles, the degree of impingement at the operator foramen and sciatic notch appears currently minimal. 2. 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 I suspect this is a chronic finding in this  patient. There is some adjacent anterior mediastinal density which is probably postoperative in considered less likely to be due to active mediastinal hematoma given the small and bandlike orientation. I do not observe a dissection.   Electronically Signed   By: Gaylyn Rong M.D.   On: 02/07/2015 16:59   Ct Wrist Left Wo Contrast  02/10/2015   CLINICAL DATA:  Status post fall 02/07/2015 with a left wrist fracture. Preoperative examination. Initial encounter.  EXAM: CT OF THE LEFT WRIST WITHOUT CONTRAST  TECHNIQUE: Multidetector CT imaging was performed according to the standard protocol. Multiplanar CT image reconstructions were also generated.  COMPARISON:  Plain films left forearm 02/07/2015.  FINDINGS: The patient is in a plaster cast. As seen on the comparison examination, the patient has a highly comminuted, intra-articular fracture of the distal radius. There is a transverse component of the fracture through the metaphysis of the radius which demonstrates minimal posterior displacement and mild impaction of approximately 0.5 cm. The fracture extends proximally into Lister's tubercle. Although the articular surface of the radius is markedly comminuted, 4 main fragments are identified. At the ulnar aspect of the articular surface, the two main fracture fragments are distracted 0.4 cm transverse by 0.5 cm AP. The radial styloid is divided into 2 fragments with distraction at the articular surface of 0.2 cm. Innumerable tiny bony fragments are identified at the articular surface.  Although not visualized on plain films, there is a minimally comminuted fracture through the junction of the mid and distal thirds of the triquetrum without notable displacement. Also seen is a nondisplaced fracture through the mid capitate. Small bony fragment is seen distal to the fovea of the ulna but no ulnar fracture is identified. Soft tissue swelling is seen about the wrist. No tendon entrapment is identified.  IMPRESSION:  Highly comminuted intra-articular fracture of the distal radius as described above.  Nondisplaced fractures of the triquetrum and capitate.   Electronically Signed   By: Drusilla Kanner M.D.   On: 02/10/2015 13:08   Dg Pelvis Portable  02/07/2015   CLINICAL DATA:  Fall for roof, about 30 feet. Femur and forearm fractures.  EXAM: PORTABLE PELVIS 1-2 VIEWS  COMPARISON:  None.  FINDINGS: Left lateral iliac crest and anterior iliac spine excluded due to difficulty with patient positioning.  Acute fractures of the left pubic rami.  IMPRESSION: 1. Acute fractures of the left pubic rami. There is a high degree of likelihood of an otherwise occult sacral fracture based on this pattern of injury; this can be assessed on  the patient's planned pelvis CT.   Electronically Signed   By: Gaylyn Rong M.D.   On: 02/07/2015 16:17   Dg Chest Portable 1 View  02/07/2015   CLINICAL DATA:  Fall 30 feet, open left femoral fracture  EXAM: PORTABLE CHEST - 1 VIEW  COMPARISON:  None.  FINDINGS: Evidence of median sternotomy. Mild prominence of the cardiomediastinal silhouette is noted which may be in part due to AP portable technique. Visualized lung fields are clear. No displaced rib fracture identified.  IMPRESSION: Borderline enlargement of the cardiomediastinal silhouette without focal acute finding.   Electronically Signed   By: Christiana Pellant M.D.   On: 02/07/2015 16:16   Dg C-arm 1-60 Min  02/08/2015   CLINICAL DATA:  Internal fixation of left femoral fracture. Initial encounter.  EXAM: LEFT FEMUR 2 VIEWS; DG C-ARM 61-120 MIN  COMPARISON:  Left femur radiographs performed earlier today at 4:06 p.m.  FINDINGS: Seven fluoroscopic C-arm images are provided from the OR. These demonstrate placement of a plate and screws transfixing the comminuted fracture through the distal femoral metadiaphysis in grossly anatomic alignment. There is minimal step-off at the intercondylar notch.  Scattered soft tissue air is noted.   IMPRESSION: Internal fixation of comminuted fracture through the distal femoral metadiaphysis in grossly anatomic alignment. Minimal residual step-off noted at the intercondylar notch.   Electronically Signed   By: Roanna Raider M.D.   On: 02/08/2015 02:29   Dg Femur Min 2 Views Left  02/08/2015   CLINICAL DATA:  Internal fixation of left femoral fracture. Initial encounter.  EXAM: LEFT FEMUR 2 VIEWS; DG C-ARM 61-120 MIN  COMPARISON:  Left femur radiographs performed earlier today at 4:06 p.m.  FINDINGS: Seven fluoroscopic C-arm images are provided from the OR. These demonstrate placement of a plate and screws transfixing the comminuted fracture through the distal femoral metadiaphysis in grossly anatomic alignment. There is minimal step-off at the intercondylar notch.  Scattered soft tissue air is noted.  IMPRESSION: Internal fixation of comminuted fracture through the distal femoral metadiaphysis in grossly anatomic alignment. Minimal residual step-off noted at the intercondylar notch.   Electronically Signed   By: Roanna Raider M.D.   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 Open femur fracture.  EXAM: LEFT FEMUR 2 VIEWS  COMPARISON:  None.  FINDINGS: Left pubic ramus fractures noted.  Left sacral fracture noted.  Left distal radial metaphyseal oblique fracture observed with 4.3 cm posterior displacement of the distal fracture fragment with respect to the proximal, and the shaft of the proximal fragment clearing the skin surface. Gas tracks in these musculature posterior to the midshaft and down towards the fracture. There is mild comminution at the fracture site. Based on the frontal projection, vertical fracture extension in the distal fragment is noted extending into the intercondylar notch. There is some overlap at the fracture site.  There is also known fracture the lateral patella.  IMPRESSION: 1. Open fracture of the  distal femur with the proximal shaft fragment extending to the skin surface, with mild comminution, and with a sagittally oriented extension from the main fracture site into the intercondylar notch. There is gas in the regional soft tissues and joint reflecting the open fracture. 2. The lateral patellar fracture is much better seen on today's CT scan. 3. Left pubic ramus and left sacral fractures.   Electronically Signed   By: Gaylyn Rong  M.D.   On: 02/07/2015 17:43   Dg Femur Port Min 2 Views Left  02/08/2015   CLINICAL DATA:  Postoperative radiograph, status post internal fixation of left femoral fracture. Subsequent encounter.  EXAM: LEFT FEMUR PORTABLE 2 VIEWS  COMPARISON:  Intraoperative radiographs performed earlier today at 8:54 p.m.  FINDINGS: The patient is status post internal fixation of the comminuted left femoral distal metadiaphyseal fracture. Fracture lines are better characterized on the current study. The fracture is noted in grossly anatomic alignment, with 1-2 mm of step-off at the intercondylar notch.  Overlying soft tissue air is seen tracking along the left side. Fractures are again noted through the left superior and inferior pubic rami, demonstrating mild displacement. The left femoral head remains seated at the acetabulum.  IMPRESSION: 1. Status post internal fixation of comminuted left femoral distal metadiaphyseal fracture. Fracture lines are better characterized on the current study. Fracture noted in grossly anatomic alignment, with a 1-2 mm of step-off noted at the intercondylar notch. 2. Mildly displaced fractures again noted through the left superior and inferior pubic rami.   Electronically Signed   By: Roanna Raider M.D.   On: 02/08/2015 02:53   Ct Maxillofacial Wo Cm  02/07/2015   CLINICAL DATA:  Larey Seat from 30 feet. Landed face first. Jaw pain. Initial encounter.  EXAM: CT HEAD WITHOUT CONTRAST  CT MAXILLOFACIAL WITHOUT CONTRAST  CT CERVICAL SPINE WITHOUT CONTRAST   TECHNIQUE: Multidetector CT imaging of the head, cervical spine, and maxillofacial structures were performed using the standard protocol without intravenous contrast. Multiplanar CT image reconstructions of the cervical spine and maxillofacial structures were also generated.  COMPARISON:  Chest and pelvic films of same date.  FINDINGS: CT HEAD FINDINGS  Sinuses/Soft tissues: Soft tissue swelling and probable laceration about the left frontal scalp. Clear paranasal sinuses and mastoid air cells. No skull fracture. Clear mastoid air cells.  Intracranial: No mass lesion, hemorrhage, hydrocephalus, acute infarct, intra-axial, or extra-axial fluid collection.  CT MAXILLOFACIAL FINDINGS  Soft tissues: Soft tissue swelling and gas about the left side of the mandible. Normal appearance of the orbits and globes, without retrobulbar hemorrhage.  Bones: Comminuted fracture of the body of the left side of the mandible. Minimally displaced. Adjacent tooth fractures.  Minimally displaced, minimally comminuted fracture of the right mandibular condyle. Example image 38 of series 3. The condyle may be minimally subluxed anteriorly.  Fractures of multiple right-sided mandibular teeth. Teeth 29 through 31. At least 1 right maxillary to fracture, including on image 32 of series 3. Tooth 5.  Zygomatic arches are intact. No fluid in the paranasal sinuses or mastoid air cells. Pterygoid plates intact. Mucosal thickening of the right maxillary sinus.  Coronal reformats demonstrate intact orbital floors.  CT CERVICAL SPINE FINDINGS  Spinal visualization through the bottom of C5 on the initial exam. From the mid C6 level inferiorly are not well evaluated secondary to motion. Prevertebral soft tissues are within normal limits. No apical pneumothorax.  Skull base intact. Maintenance of vertebral body height through the C5 level. Facets are well-aligned. Coronal reformats demonstrate a normal C1-C2 articulation.  Subsequent repeat imaging  includes from C6 through the bottom of T1. No prevertebral soft tissue swelling across these levels. No fracture or subluxation identified.  IMPRESSION: 1. Left frontal scalp soft tissue swelling, without acute intracranial abnormality. 2. Left mandibular body comminuted fracture. Right mandibular condyle fracture with suggestion of subluxation. Bilateral fractured teeth. 3. No acute findings within the cervical spine.   Electronically Signed  By: Jeronimo Greaves M.D.   On: 02/07/2015 17:07    Microbiology: Recent Results (from the past 240 hour(s))  MRSA PCR Screening     Status: None   Collection Time: 02/08/15  1:18 AM  Result Value Ref Range Status   MRSA by PCR NEGATIVE NEGATIVE Final    Comment:        The GeneXpert MRSA Assay (FDA approved for NASAL specimens only), is one component of a comprehensive MRSA colonization surveillance program. It is not intended to diagnose MRSA infection nor to guide or monitor treatment for MRSA infections.      Labs: Basic Metabolic Panel:  Recent Labs Lab 02/07/15 1542 02/07/15 1700 02/08/15 0254  NA 142 139 138  K 2.9* 3.1* 4.4  CL 105 105 104  CO2  --  21 23  GLUCOSE 135* 124* 135*  BUN 12 11 9   CREATININE 1.00 1.11 0.89  CALCIUM  --  10.1 8.8   Liver Function Tests:  Recent Labs Lab 02/07/15 1700  AST 63*  ALT 45  ALKPHOS 90  BILITOT 0.7  PROT 7.9  ALBUMIN 4.9   No results for input(s): LIPASE, AMYLASE in the last 168 hours. No results for input(s): AMMONIA in the last 168 hours. CBC:  Recent Labs Lab 02/07/15 1542 02/07/15 1700 02/08/15 0254 02/09/15 0745  WBC  --  20.9* 17.0* 13.1*  HGB 17.7* 16.5 12.1* 9.6*  HCT 52.0 47.4 35.5* 27.6*  MCV  --  87.3 88.3 87.3  PLT  --  328 199 171   Cardiac Enzymes: No results for input(s): CKTOTAL, CKMB, CKMBINDEX, TROPONINI in the last 168 hours. BNP: BNP (last 3 results) No results for input(s): BNP in the last 8760 hours.  ProBNP (last 3 results) No results for  input(s): PROBNP in the last 8760 hours.  CBG: No results for input(s): GLUCAP in the last 168 hours.  Active Problems:   Fall   Concussion   Bilateral mandibular fracture   Face lacerations   Tooth fractures   Left wrist fracture   Fracture of metacarpal of left hand, closed   Multiple pelvic fractures   Open femur fracture, left   Left patella fracture   Acute blood loss anemia   Time coordinating discharge: >30 mins  Signed:  Ashok Norris, ANP-BC [facility] [phone]

## 2015-02-12 NOTE — Progress Notes (Signed)
Physical Therapy Treatment Patient Details Name: [name] [name] MRN: [id number] DOB: October 12, 1996 Today's Date: 02/12/2015    History of Present Illness 19 y.o. 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 He was knocked unconscious. So far injuries include open left femur fracture (s/p ORIF: NWBing), mandibular injuries, left radial head fracture left distal radius fracture left capitate fracture left triquetrum fracture (s/p ORIF: NWBing except through elbow), left LC 1/2 pelvis fracture.    PT Comments    Patient is making good progress with PT.  Pt is anticipating d/c to home today.  Pt demonstrated ability to ambulate 16 ft today w/ improved stability and no loss of balance.  Pt reports he has a hospital bed at home.  Follow Up Recommendations  Supervision/Assistance - 24 hour     Equipment Recommendations  3in1 (PT);Wheelchair (measurements PT);Wheelchair cushion (measurements PT);Rolling walker with 5" wheels (L platform RW)    Recommendations for Other Services       Precautions / Restrictions Precautions Precautions: Fall Required Braces or Orthoses: Knee Immobilizer - Left;Other Brace/Splint Knee Immobilizer - Left: On at all times Other Brace/Splint: LUE bi-valve cast with ace wrap Restrictions Weight Bearing Restrictions: Yes LUE Weight Bearing: Weight bear through elbow only LLE Weight Bearing: Non weight bearing    Mobility  Bed Mobility Overal bed mobility: Needs Assistance Bed Mobility: Supine to Sit     Supine to sit: Min assist;+2 for safety/equipment     General bed mobility comments: assist w/ bringing LLE to EOB, use of rails, and support posterior trunk to prevent post lean  Transfers Overall transfer level: Needs assistance Equipment used: Left platform walker Transfers: Sit to/from Stand Sit to Stand: Min assist;+2 physical assistance;+2 safety/equipment;From elevated surface         General transfer comment:  Pt requests assist w/ ensuring LLE is out in front during sit>stand.  Verbal cues for proper hand placement during sit<>stand transfers and poor control stand>sit  Ambulation/Gait Ambulation/Gait assistance: Min guard;+2 safety/equipment Ambulation Distance (Feet): 16 Feet Assistive device: Rolling walker (2 wheeled) (L platform RW) Gait Pattern/deviations: Step-to pattern;Antalgic;Decreased weight shift to left (hop on RLE)   Gait velocity interpretation: Below normal speed for age/gender General Gait Details: Pt improved w/ speed and stability during ambulation and did not lose his balance   Stairs            Wheelchair Mobility    Modified Rankin (Stroke Patients Only)       Balance Overall balance assessment: Needs assistance Sitting-balance support: Single extremity supported;Feet supported Sitting balance-Leahy Scale: Fair     Standing balance support: Bilateral upper extremity supported;During functional activity Standing balance-Leahy Scale: Fair                      Cognition Arousal/Alertness: Awake/alert Behavior During Therapy: WFL for tasks assessed/performed Overall Cognitive Status: Within Functional Limits for tasks assessed                      Exercises Total Joint Exercises Ankle Circles/Pumps: AROM;Both;10 reps;Seated Hip ABduction/ADduction: AAROM;Left;5 reps;Seated Straight Leg Raises: AAROM;Left;5 reps;Seated    General Comments        Pertinent Vitals/Pain Pain Assessment: 0-10 Pain Score: 8  Pain Location: LLE Pain Descriptors / Indicators: Aching;Heaviness;Constant Pain Intervention(s): Limited activity within patient's tolerance;Monitored during session;Premedicated before session;Repositioned    Home Living  Prior Function            PT Goals (current goals can now be found in the care plan section) Acute Rehab PT Goals Patient Stated Goal: none stated PT Goal Formulation: With  patient Time For Goal Achievement: 02/22/15 Potential to Achieve Goals: Good Progress towards PT goals: Progressing toward goals    Frequency  Min 5X/week    PT Plan Current plan remains appropriate    Co-evaluation             End of Session Equipment Utilized During Treatment: Left knee immobilizer;Other (comment);Gait belt (L arm bivalve cast) Activity Tolerance: Patient limited by fatigue;Patient tolerated treatment well (fatigue of RLE) Patient left: in chair;with call bell/phone within reach;with family/visitor present     Time: [id number] PT Time Calculation (min) (ACUTE ONLY): 26 min  Charges:  $Gait Training: 23-37 mins                    G CodesMichail Jewels:      Briget Shaheed Parr PT, [place]PT [phone]  [phone] 02/12/2015, 11:27 AM

## 2015-02-19 ENCOUNTER — Ambulatory Visit (INDEPENDENT_AMBULATORY_CARE_PROVIDER_SITE_OTHER): Payer: No Typology Code available for payment source | Admitting: Orthopaedic Trauma

## 2015-02-24 ENCOUNTER — Encounter (HOSPITAL_COMMUNITY): Payer: Self-pay | Admitting: Orthopedic Surgery

## 2015-05-28 ENCOUNTER — Encounter (INDEPENDENT_AMBULATORY_CARE_PROVIDER_SITE_OTHER): Payer: Medicaid Other | Admitting: Pediatric Cardiology

## 2015-08-27 ENCOUNTER — Ambulatory Visit (INDEPENDENT_AMBULATORY_CARE_PROVIDER_SITE_OTHER): Payer: Medicaid Other | Admitting: Pediatric Cardiology

## 2015-08-27 VITALS — BP 133/68 | HR 86 | Ht 71.97 in | Wt 211.2 lb

## 2015-08-27 DIAGNOSIS — I352 Nonrheumatic aortic (valve) stenosis with insufficiency: Secondary | ICD-10-CM

## 2015-08-27 DIAGNOSIS — Z9889 Other specified postprocedural states: Secondary | ICD-10-CM

## 2015-08-27 NOTE — Progress Notes (Signed)
I had the pleasure of seeing your patient, Michael Elliott, in the [facility] [place] today. As you recall, he is a 19 year old male with a history of a bicuspid aortic valve and aortic stenosis. He underwent an aortic valvotomy on April 26, 2001, for moderate aortic valve stenosis. 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 He presents today for a follow up evaluation. Over the past 2 years, he has done well without cardiac symptomatology. He feels an increased amount of energy and is less short of breath since having his valve replaced. The patient has been free of palpitations, syncope, chest pain, tachycardia or exercise intolerance. 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 He sustained a broken arm and leg. He has since recovered. The patient is receiving aspirin 325 mg orally once daily. He has lost a significant amount of weight since I last saw him. His BMI was 44 and now is 29 kg/m2. All other systems are negative. The patient has had no significant illnesses or hospitalizations. The patient's family history is negative for congenital heart defects, sudden cardiac death or arrhythmias. The patient is a Corporate investment bankerconstruction worker.    On physical exam, the patient weighs 95.8 kg with a height of 183 cm. His BMI is 29 kg/m2. The patient's heart rate is 86 bpm, respiratory rate 18, blood pressure is 133/68 in the right arm (he admits to being nervous).  In general, the patient is a healthy appearing, acyanotic young man in no acute distress. His HEENT exam is unremarkable. The neck is supple. The lungs are clear to auscultation bilaterally. The precordium is quiet with a well healed sternotomy scar. The cardiovascular exam reveals normal first and second heart sounds with a 2-3/6 systolic ejection murmur at  the right upper sternal border and a 2/6 short diastolic murmur heard just below that. The abdomen is soft without hepatosplenomegaly. The extremities are warm well-perfused with 2+ pulses throughout. There is no femoral brachial delay.  The neurologic examination is grossly within normal limits.    I reviewed and discussed his cardiac testing from today which includes an electrocardiogram that showed normal sinus rhythm with left ventricular hypertrophy and strain that is similar to previous tracings.     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 He is asymptomatic. Symptoms, left enlargement and dysfunctions would be indicators to consider repeat valve replacement. I will schedule an echocardiogram within the next 2 months and I will contact him with the results. I have decreased his aspirin to 162 mg po daily. I have arranged to see him back in this clinic in 1 year with an echocardiogram at that time. He requires SBE prophylaxis. He should receive an annual flu vaccine. I discussed the diagnosis at length with Michael Elliott and answered his questions. I asked that he continue routine evaluations with you.    Thank you for allowing me to participate in [place] care.     Sincerely,    Melba CoonJohn R. Henya Aguallo, MD  Professor of Pediatrics  [facility] Department of Pediatrics  Section of Pediatric Cardiology    Electronic signature on file.

## 2015-08-31 ENCOUNTER — Encounter (HOSPITAL_COMMUNITY): Payer: Self-pay | Admitting: Orthopedic Surgery

## 2015-10-06 ENCOUNTER — Ambulatory Visit (INDEPENDENT_AMBULATORY_CARE_PROVIDER_SITE_OTHER): Payer: Self-pay | Admitting: Pediatric Cardiology

## 2015-10-12 ENCOUNTER — Telehealth (INDEPENDENT_AMBULATORY_CARE_PROVIDER_SITE_OTHER): Payer: Self-pay | Admitting: NURSE PRACTITIONER-PEDIATRICS

## 2015-10-12 NOTE — Telephone Encounter (Signed)
We received a call from [facility] requesting [facility] for IV sedation and dental extractions for Michael Elliott.  Michael Elliott was seen by Dr. Vear ClockPhillips in [facility] in October of this year at which time an echocardiogram was requested within the next 2 months.  After discussion of the case with Dr. Vear ClockPhillips, I have notified the [facility] that we will be unable to provide clearance until the echocardiogram is completed.    I called the phone number in the system for Michael Elliott to remind him of the study, but did not get an answer.  I left a message on the voice mail identifying it as a number for Michael Elliott asking that Michael Elliott contact our office regarding testing.  Michael Elliott is listed as Michael Elliott's father.    Michael Elliott, PNP-BC  10/12/2015, 13:41

## 2016-03-07 IMAGING — CT CT WRIST*L* W/O CM
3 of 4 series · 13 of 35 positions shown, 16 images · non-contrast
Comparison: Plain films left forearm 02/07/2015.

CLINICAL DATA: Status post fall 02/07/2015 with a left wrist
fracture. Preoperative examination. Initial encounter.

EXAM:
CT OF THE LEFT WRIST WITHOUT CONTRAST
TECHNIQUE: Multidetector CT imaging was performed according to the standard
protocol. Multiplanar CT image reconstructions were also generated.

[Series 4: lfov ext 3.0 b40s · axial · 0.26mm/px · z∈[+1164,+1248]mm · 5 of 44 slices shown, 7 images]
[im 8/44  soft-tissue]
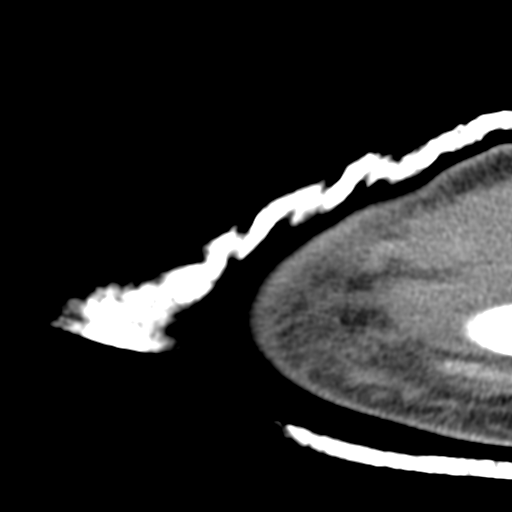
[im 8/44  bone]
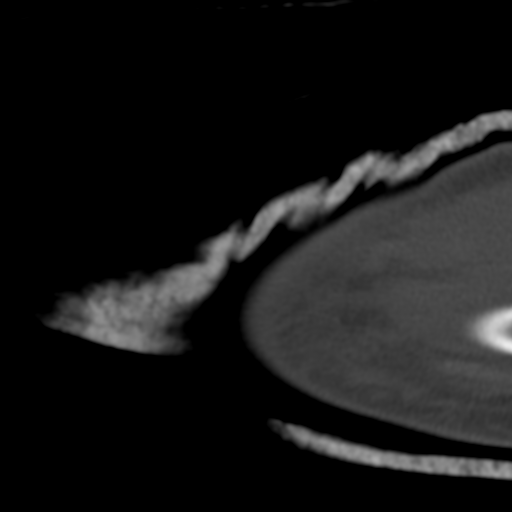
[im 15/44  bone]
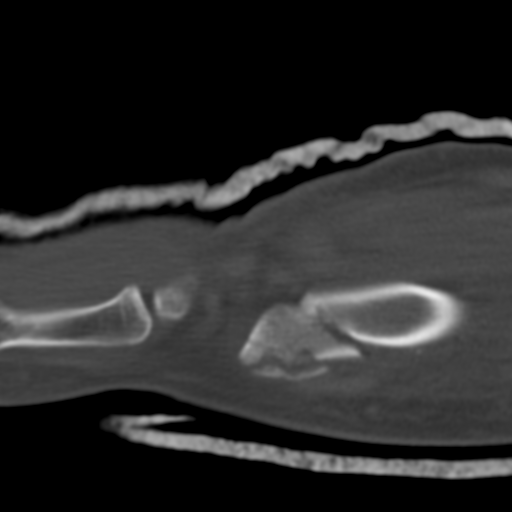
[im 22/44  bone]
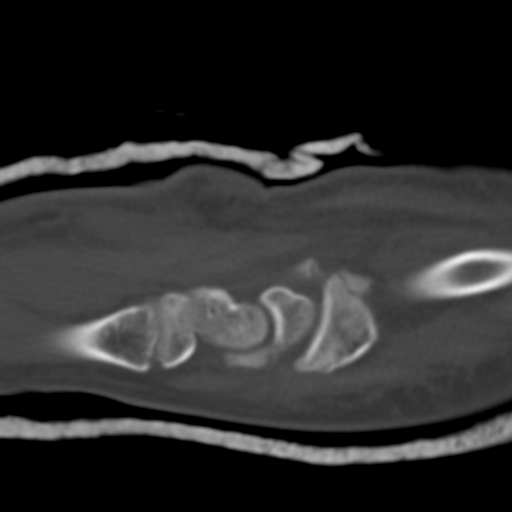
[im 29/44  bone]
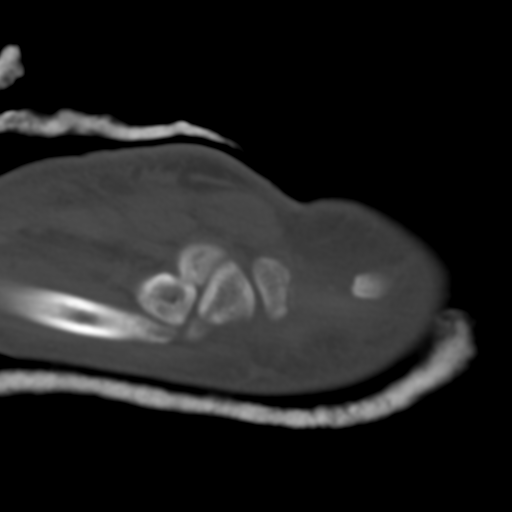
[im 36/44  soft-tissue]
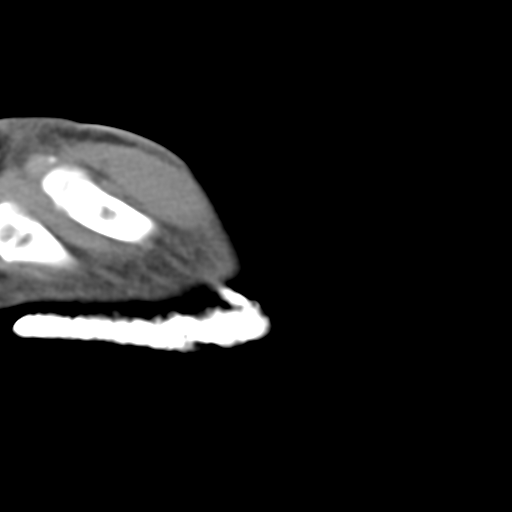
[im 36/44  bone]
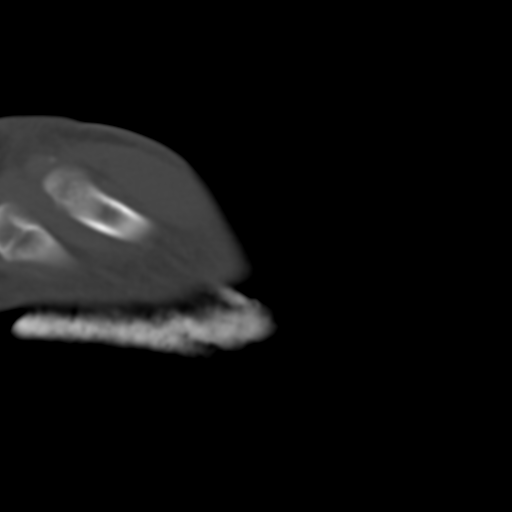

[Series 8: coronal st · coronal · 0.21mm/px · 3 of 89 slices shown]
[im 22/89  bone]
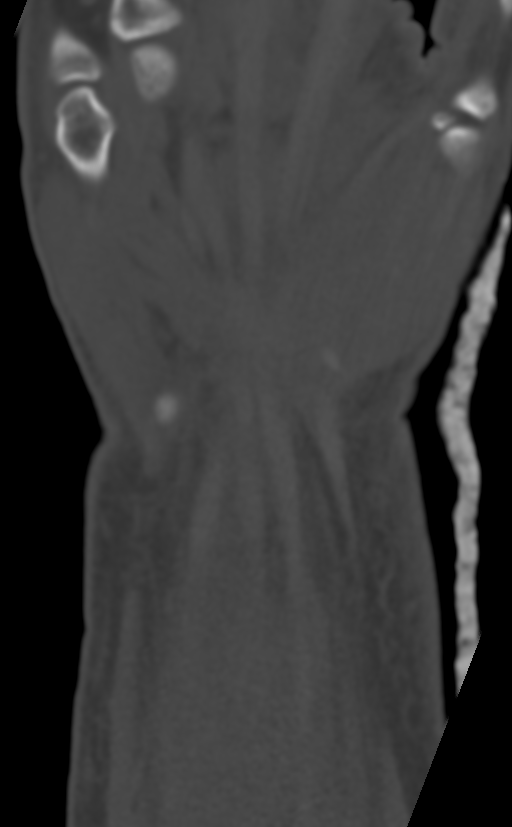
[im 37/89  bone]
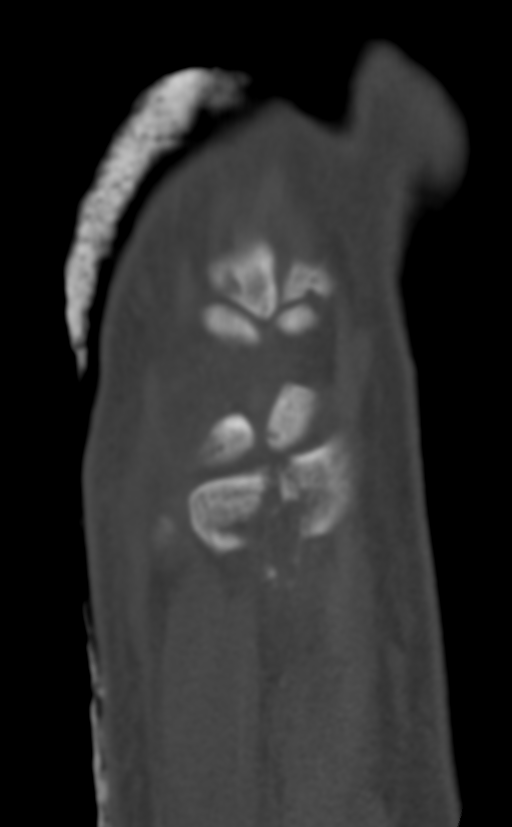
[im 52/89  bone]
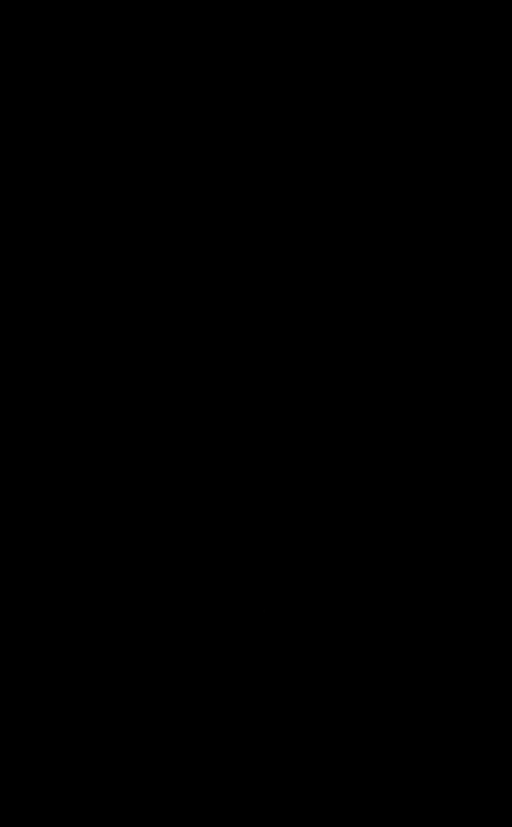

[Series 12: axial st · sagittal · 0.26mm/px · 5 of 69 slices shown, 6 images]
[im 23/69  bone]
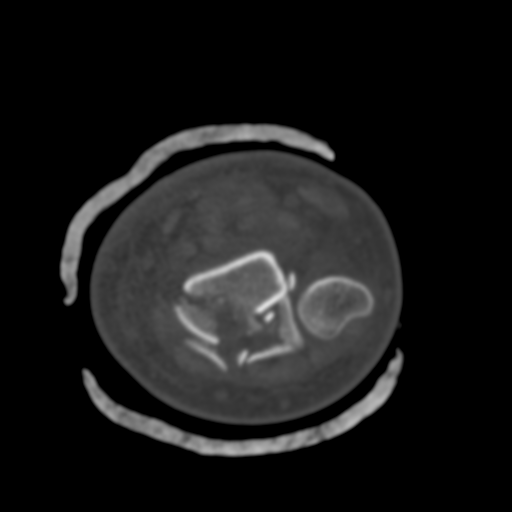
[im 29/69  bone]
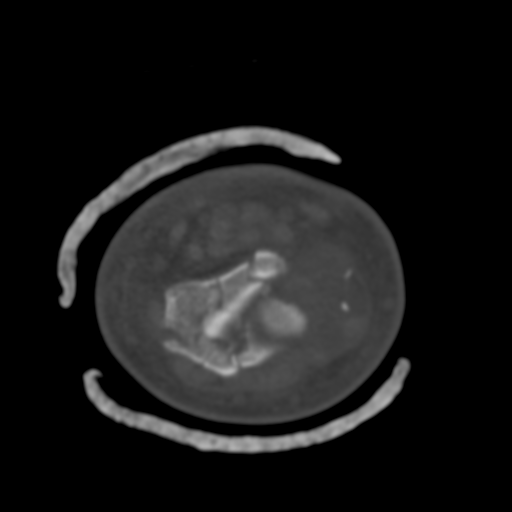
[im 35/69  soft-tissue]
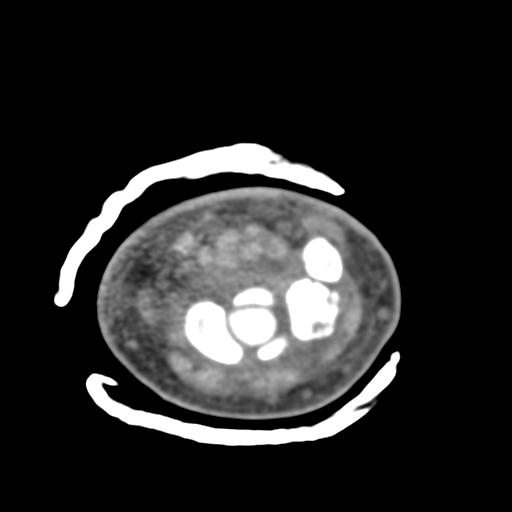
[im 35/69  bone]
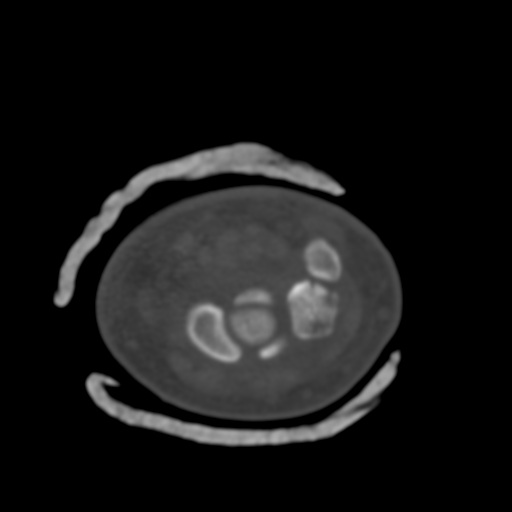
[im 40/69  bone]
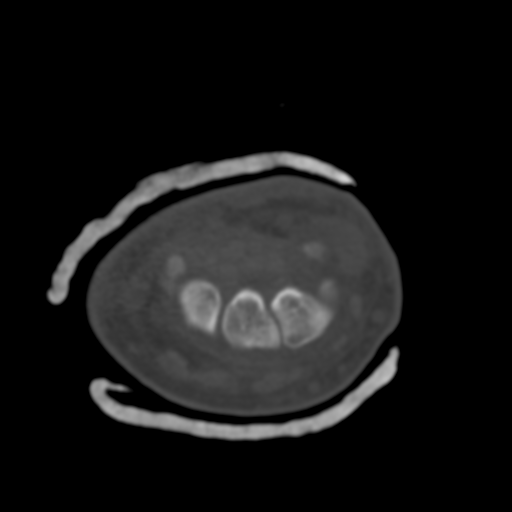
[im 46/69  bone]
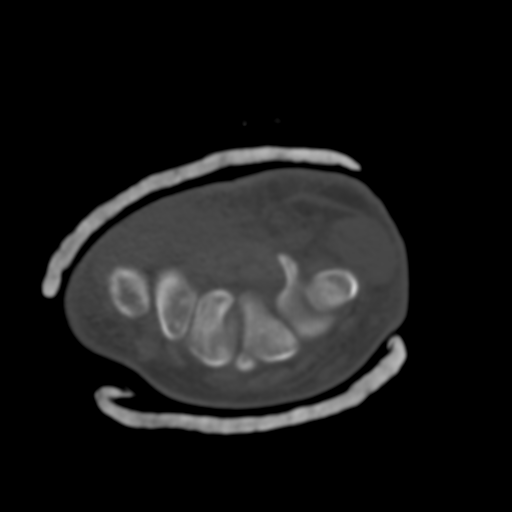

[13 of 35 positions shown; findings below may reference images not displayed]

FINDINGS: The patient is in a plaster cast. As seen on the comparison
examination, the patient has a highly comminuted, intra-articular
fracture of the distal radius. There is a transverse component of
the fracture through the metaphysis of the radius which demonstrates
minimal posterior displacement and mild impaction of approximately
0.5 cm. The fracture extends proximally into Lister's tubercle.
Although the articular surface of the radius is markedly comminuted,
4 main fragments are identified. At the ulnar aspect of the
articular surface, the two main fracture fragments are distracted
0.4 cm transverse by 0.5 cm AP. The radial styloid is divided into 2
fragments with distraction at the articular surface of 0.2 cm.
Innumerable tiny bony fragments are identified at the articular
surface.

Although not visualized on plain films, there is a minimally
comminuted fracture through the junction of the mid and distal
thirds of the triquetrum without notable displacement. Also seen is
a nondisplaced fracture through the mid capitate. Small bony
fragment is seen distal to the fovea of the ulna but no ulnar
fracture is identified. Soft tissue swelling is seen about the
wrist. No tendon entrapment is identified.
IMPRESSION: Highly comminuted intra-articular fracture of the distal radius as
described above.

Nondisplaced fractures of the triquetrum and capitate.

## 2017-11-07 DEATH — deceased

## 2020-12-22 ENCOUNTER — Encounter (INDEPENDENT_AMBULATORY_CARE_PROVIDER_SITE_OTHER): Payer: Self-pay

## 2022-12-25 ENCOUNTER — Encounter (INDEPENDENT_AMBULATORY_CARE_PROVIDER_SITE_OTHER): Payer: Self-pay
